# Patient Record
Sex: Female | Born: 1950
Health system: Southern US, Community
[De-identification: ages and names within clinical notes are randomized; demographics above are authoritative.]

## PROBLEM LIST (undated history)

## (undated) DIAGNOSIS — M1990 Unspecified osteoarthritis, unspecified site: Secondary | ICD-10-CM

## (undated) DIAGNOSIS — F419 Anxiety disorder, unspecified: Secondary | ICD-10-CM

## (undated) DIAGNOSIS — I10 Essential (primary) hypertension: Secondary | ICD-10-CM

## (undated) HISTORY — PX: JOINT REPLACEMENT: SHX530

## (undated) HISTORY — PX: SHOULDER SURGERY: SHX246

## (undated) HISTORY — PX: BREAST SURGERY: SHX581

## (undated) HISTORY — PX: BREAST CYST ASPIRATION: SHX578

## (undated) HISTORY — PX: ANKLE SURGERY: SHX546

## (undated) HISTORY — PX: BREAST CYST EXCISION: SHX579

---

## 2015-07-17 ENCOUNTER — Encounter: Payer: Self-pay | Admitting: *Deleted

## 2015-07-20 ENCOUNTER — Encounter
Admission: RE | Disposition: A | Discharge status: Home Health | Payer: Self-pay | Source: Ambulatory Visit | Attending: Unknown Physician Specialty

## 2015-07-20 ENCOUNTER — Encounter: Payer: Self-pay | Admitting: *Deleted

## 2015-07-20 ENCOUNTER — Ambulatory Visit: Payer: Medicare Other | Admitting: Anesthesiology

## 2015-07-20 ENCOUNTER — Ambulatory Visit
Admission: RE | Admit: 2015-07-20 | Discharge: 2015-07-20 | Disposition: A | Discharge status: Home Health | Payer: Medicare Other | Source: Ambulatory Visit | Attending: Unknown Physician Specialty | Admitting: Unknown Physician Specialty

## 2015-07-20 DIAGNOSIS — F17200 Nicotine dependence, unspecified, uncomplicated: Secondary | ICD-10-CM | POA: Insufficient documentation

## 2015-07-20 DIAGNOSIS — Z79899 Other long term (current) drug therapy: Secondary | ICD-10-CM | POA: Insufficient documentation

## 2015-07-20 DIAGNOSIS — K640 First degree hemorrhoids: Secondary | ICD-10-CM | POA: Diagnosis not present

## 2015-07-20 DIAGNOSIS — Z1211 Encounter for screening for malignant neoplasm of colon: Secondary | ICD-10-CM | POA: Insufficient documentation

## 2015-07-20 DIAGNOSIS — I10 Essential (primary) hypertension: Secondary | ICD-10-CM | POA: Diagnosis not present

## 2015-07-20 DIAGNOSIS — K5730 Diverticulosis of large intestine without perforation or abscess without bleeding: Secondary | ICD-10-CM | POA: Diagnosis not present

## 2015-07-20 DIAGNOSIS — Z9660 Presence of unspecified orthopedic joint implant: Secondary | ICD-10-CM | POA: Insufficient documentation

## 2015-07-20 DIAGNOSIS — J449 Chronic obstructive pulmonary disease, unspecified: Secondary | ICD-10-CM | POA: Diagnosis not present

## 2015-07-20 DIAGNOSIS — D122 Benign neoplasm of ascending colon: Secondary | ICD-10-CM | POA: Insufficient documentation

## 2015-07-20 DIAGNOSIS — D123 Benign neoplasm of transverse colon: Secondary | ICD-10-CM | POA: Insufficient documentation

## 2015-07-20 DIAGNOSIS — D125 Benign neoplasm of sigmoid colon: Secondary | ICD-10-CM | POA: Diagnosis not present

## 2015-07-20 DIAGNOSIS — F419 Anxiety disorder, unspecified: Secondary | ICD-10-CM | POA: Diagnosis not present

## 2015-07-20 DIAGNOSIS — M1990 Unspecified osteoarthritis, unspecified site: Secondary | ICD-10-CM | POA: Diagnosis not present

## 2015-07-20 HISTORY — DX: Essential (primary) hypertension: I10

## 2015-07-20 HISTORY — PX: COLONOSCOPY WITH PROPOFOL: SHX5780

## 2015-07-20 HISTORY — DX: Unspecified osteoarthritis, unspecified site: M19.90

## 2015-07-20 HISTORY — DX: Anxiety disorder, unspecified: F41.9

## 2015-07-20 SURGERY — COLONOSCOPY WITH PROPOFOL
Anesthesia: General

## 2015-07-20 MED ORDER — LIDOCAINE HCL (CARDIAC) 20 MG/ML IV SOLN
INTRAVENOUS | Status: DC | PRN
  Administered 2015-07-20: 67 mg via INTRAVENOUS

## 2015-07-20 MED ORDER — EPHEDRINE SULFATE 50 MG/ML IJ SOLN
INTRAMUSCULAR | Status: DC | PRN
  Administered 2015-07-20: 5 mg via INTRAVENOUS

## 2015-07-20 MED ORDER — MIDAZOLAM HCL 5 MG/5ML IJ SOLN
INTRAMUSCULAR | Status: DC | PRN
  Administered 2015-07-20: 1 mg via INTRAVENOUS

## 2015-07-20 MED ORDER — SODIUM CHLORIDE 0.9 % IV SOLN: INTRAVENOUS | Status: DC

## 2015-07-20 MED ORDER — FENTANYL CITRATE (PF) 100 MCG/2ML IJ SOLN
INTRAMUSCULAR | Status: DC | PRN
  Administered 2015-07-20: 50 ug via INTRAVENOUS

## 2015-07-20 MED ORDER — GLYCOPYRROLATE 0.2 MG/ML IJ SOLN
INTRAMUSCULAR | Status: DC | PRN
  Administered 2015-07-20: 0.2 mg via INTRAVENOUS

## 2015-07-20 MED ORDER — SODIUM CHLORIDE 0.9 % IV SOLN
INTRAVENOUS | Status: DC
  Administered 2015-07-20: 1000 mL via INTRAVENOUS

## 2015-07-20 MED ORDER — PROPOFOL 500 MG/50ML IV EMUL
INTRAVENOUS | Status: DC | PRN
  Administered 2015-07-20: 130 ug/kg/min via INTRAVENOUS

## 2015-07-20 MED ORDER — PROPOFOL 10 MG/ML IV BOLUS
INTRAVENOUS | Status: DC | PRN
  Administered 2015-07-20: 25 mg via INTRAVENOUS

## 2015-07-20 NOTE — Anesthesia Preprocedure Evaluation (Signed)
Anesthesia Evaluation  Patient identified by MRN, date of birth, ID band Patient awake    Reviewed: Allergy & Precautions, NPO status , Patient's Chart, lab work & pertinent test results  Airway Mallampati: II       Dental  (+) Teeth Intact   Pulmonary COPD, Current Smoker,     + decreased breath sounds      Cardiovascular hypertension,  Rhythm:Regular Rate:Normal     Neuro/Psych    GI/Hepatic negative GI ROS, Neg liver ROS,   Endo/Other  negative endocrine ROS  Renal/GU negative Renal ROS     Musculoskeletal negative musculoskeletal ROS (+)   Abdominal Normal abdominal exam  (+)   Peds negative pediatric ROS (+)  Hematology negative hematology ROS (+)   Anesthesia Other Findings   Reproductive/Obstetrics                             Anesthesia Physical Anesthesia Plan  ASA: II  Anesthesia Plan: General   Post-op Pain Management:    Induction: Intravenous  Airway Management Planned: Nasal Cannula  Additional Equipment:   Intra-op Plan:   Post-operative Plan:   Informed Consent: I have reviewed the patients History and Physical, chart, labs and discussed the procedure including the risks, benefits and alternatives for the proposed anesthesia with the patient or authorized representative who has indicated his/her understanding and acceptance.     Plan Discussed with: CRNA  Anesthesia Plan Comments:         Anesthesia Quick Evaluation

## 2015-07-20 NOTE — Transfer of Care (Signed)
Immediate Anesthesia Transfer of Care Note  Patient: Colleen Schaefer  Procedure(s) Performed: Procedure(s): COLONOSCOPY WITH PROPOFOL (N/A)  Patient Location: PACU  Anesthesia Type:General  Level of Consciousness: sedated  Airway & Oxygen Therapy: Patient Spontanous Breathing and Patient connected to nasal cannula oxygen  Post-op Assessment: Report given to RN and Post -op Vital signs reviewed and stable  Post vital signs: Reviewed and stable  Last Vitals:  Filed Vitals:   07/20/15 1517  BP: 94/51  Pulse: 69  Temp: 36 C  Resp: 22    Complications: No apparent anesthesia complications

## 2015-07-20 NOTE — Op Note (Signed)
[facility] Gastroenterology Patient Name: Colleen Schaefer Procedure Date: 07/20/2015 2:11 PM MRN: [id number] Account #: [id number] Date of Birth: 19-Jun-1951 Admit Type: Outpatient Age: 64 Room: [facility] ENDO ROOM 1 Gender: Female Note Status: Finalized Procedure:         Colonoscopy Indications:       Screening for colorectal malignant neoplasm Providers:         Manya Silvas, MD Referring MD:      Reyes Ivan, MD (Referring MD) Medicines:         Propofol per Anesthesia Complications:     No immediate complications. Procedure:         Pre-Anesthesia Assessment:                    - After reviewing the risks and benefits, the patient was                     deemed in satisfactory condition to undergo the procedure.                    After obtaining informed consent, the colonoscope was                     passed under direct vision. Throughout the procedure, the                     patient's blood pressure, pulse, and oxygen saturations                     were monitored continuously. The Colonoscope was                     introduced through the anus and advanced to the the cecum,                     identified by appendiceal orifice and ileocecal valve. The                     colonoscopy was technically difficult and complex due to                     restricted mobility of the colon and a tortuous colon.                     Successful completion of the procedure was aided by                     applying abdominal pressure. The patient tolerated the                     procedure well. The quality of the bowel preparation was                     excellent. Findings:      The sigmoid was very difficult and progress was slow and careful      A medium polyp was found at the hepatic flexure. The polyp was sessile.       The polyp was removed with a piecemeal technique using a hot snare.       Resection and retrieval were complete. To prevent bleeding after the        polypectomy, two hemostatic clips were successfully placed. There was no       bleeding during, and at the end, of  the procedure.      Many sessile polyps were found in the sigmoid colon. The polyps were       small in size. These polyps were removed with a hot snare. Resection and       retrieval were complete. bottles 1, 5,6,7      small polyps in Ascending colon placed in jar 2,3      Multiple small-mouthed diverticula were found in the sigmoid colon and       in the descending colon.      Internal hemorrhoids were found during endoscopy. The hemorrhoids were       small and Grade I (internal hemorrhoids that do not prolapse). Impression:        - One medium polyp at the hepatic flexure. Resected and                     retrieved. Clips were placed.                    - Many small polyps in the sigmoid colon. Resected and                     retrieved.                    - Diverticulosis in the sigmoid colon and in the                     descending colon.                    - Internal hemorrhoids. Recommendation:    - Await pathology results. Manya Silvas, MD 07/20/2015 3:15:44 PM This report has been signed electronically. Number of Addenda: 0 Note Initiated On: 07/20/2015 2:11 PM Total Procedure Duration: 0 hours 54 minutes 4 seconds       [facility]

## 2015-07-20 NOTE — H&P (Signed)
   Primary Care Physician:  Lynnell Jude, MD Primary Gastroenterologist:  Dr. Vira Agar  Pre-Procedure History & Physical: HPI:  Colleen Schaefer is a 64 y.o. female is here for an colonoscopy.   Past Medical History  Diagnosis Date  . Anxiety   . Arthritis   . Hypertension     Past Surgical History  Procedure Laterality Date  . Ankle surgery    . Breast surgery      Cyst  . Shoulder surgery    . Joint replacement      Prior to Admission medications   Medication Sig Start Date End Date Taking? Authorizing Provider  escitalopram (LEXAPRO) 10 MG tablet Take 10 mg by mouth daily.   Yes Historical Provider, MD  estradiol (ESTRACE) 0.1 MG/GM vaginal cream Place 2 Applicatorfuls vaginally daily.   Yes Historical Provider, MD  meloxicam (MOBIC) 7.5 MG tablet Take 7.5 mg by mouth daily.   Yes Historical Provider, MD    Allergies as of 06/16/2015  . (Not on File)    History reviewed. No pertinent family history.  Social History   Social History  . Marital Status: Married    Spouse Name: N/A  . Number of Children: N/A  . Years of Education: N/A   Occupational History  . Not on file.   Social History Main Topics  . Smoking status: Current Every Day Smoker -- 1.00 packs/day for 40 years  . Smokeless tobacco: Never Used  . Alcohol Use: Yes  . Drug Use: No  . Sexual Activity: Not on file   Other Topics Concern  . Not on file   Social History Narrative    Review of Systems: See HPI, otherwise negative ROS  Physical Exam: BP 122/69 mmHg  Pulse 78  Temp(Src) 98.5 F (36.9 C) (Oral)  Resp 20  Ht 5\' 2"  (1.575 m)  Wt 58.968 kg (130 lb)  BMI 23.77 kg/m2  SpO2 100% General:   Alert,  pleasant and cooperative in NAD Head:  Normocephalic and atraumatic. Neck:  Supple; no masses or thyromegaly. Lungs:  Clear throughout to auscultation.    Heart:  Regular rate and rhythm. Abdomen:  Soft, nontender and nondistended. Normal bowel sounds, without guarding, and without  rebound.   Neurologic:  Alert and  oriented x4;  grossly normal neurologically.  Impression/Plan: Colleen Schaefer is here for an colonoscopy to be performed for screening  Risks, benefits, limitations, and alternatives regarding  colonoscopy have been reviewed with the patient.  Questions have been answered.  All parties agreeable.   Gaylyn Cheers, MD  07/20/2015, 2:07 PM

## 2015-07-21 NOTE — Anesthesia Postprocedure Evaluation (Signed)
  Anesthesia Post-op Note  Patient: Colleen Schaefer  Procedure(s) Performed: Procedure(s): COLONOSCOPY WITH PROPOFOL (N/A)  Anesthesia type:General  Patient location: PACU  Post pain: Pain level controlled  Post assessment: Post-op Vital signs reviewed, Patient's Cardiovascular Status Stable, Respiratory Function Stable, Patent Airway and No signs of Nausea or vomiting  Post vital signs: Reviewed and stable  Last Vitals:  Filed Vitals:   07/20/15 1545  BP: 116/60  Pulse: 70  Temp:   Resp: 16    Level of consciousness: awake, alert  and patient cooperative  Complications: No apparent anesthesia complications

## 2015-07-22 ENCOUNTER — Encounter: Payer: Self-pay | Admitting: Unknown Physician Specialty

## 2015-07-22 LAB — SURGICAL PATHOLOGY

## 2015-07-24 NOTE — Addendum Note (Signed)
Addendum  created 07/24/15 1618 by Iver Nestle, MD   Modules edited: Anesthesia Attestations

## 2015-08-17 ENCOUNTER — Encounter: Payer: Self-pay | Admitting: Emergency Medicine

## 2015-08-17 ENCOUNTER — Inpatient Hospital Stay
Admission: EM | Admit: 2015-08-17 | Discharge: 2015-08-20 | DRG: 552 | Disposition: A | Discharge status: Skilled Nursing Facility | Payer: Medicare Other | Attending: Internal Medicine | Admitting: Internal Medicine

## 2015-08-17 ENCOUNTER — Emergency Department: Payer: Medicare Other

## 2015-08-17 DIAGNOSIS — Z79899 Other long term (current) drug therapy: Secondary | ICD-10-CM

## 2015-08-17 DIAGNOSIS — Y929 Unspecified place or not applicable: Secondary | ICD-10-CM

## 2015-08-17 DIAGNOSIS — I959 Hypotension, unspecified: Secondary | ICD-10-CM | POA: Diagnosis present

## 2015-08-17 DIAGNOSIS — S3210XA Unspecified fracture of sacrum, initial encounter for closed fracture: Secondary | ICD-10-CM | POA: Diagnosis present

## 2015-08-17 DIAGNOSIS — I10 Essential (primary) hypertension: Secondary | ICD-10-CM | POA: Diagnosis present

## 2015-08-17 DIAGNOSIS — B9620 Unspecified Escherichia coli [E. coli] as the cause of diseases classified elsewhere: Secondary | ICD-10-CM | POA: Diagnosis present

## 2015-08-17 DIAGNOSIS — Z9889 Other specified postprocedural states: Secondary | ICD-10-CM | POA: Diagnosis not present

## 2015-08-17 DIAGNOSIS — F329 Major depressive disorder, single episode, unspecified: Secondary | ICD-10-CM | POA: Diagnosis present

## 2015-08-17 DIAGNOSIS — Z9660 Presence of unspecified orthopedic joint implant: Secondary | ICD-10-CM | POA: Diagnosis present

## 2015-08-17 DIAGNOSIS — Z809 Family history of malignant neoplasm, unspecified: Secondary | ICD-10-CM

## 2015-08-17 DIAGNOSIS — Z8249 Family history of ischemic heart disease and other diseases of the circulatory system: Secondary | ICD-10-CM

## 2015-08-17 DIAGNOSIS — S329XXA Fracture of unspecified parts of lumbosacral spine and pelvis, initial encounter for closed fracture: Secondary | ICD-10-CM

## 2015-08-17 DIAGNOSIS — M1990 Unspecified osteoarthritis, unspecified site: Secondary | ICD-10-CM | POA: Diagnosis present

## 2015-08-17 DIAGNOSIS — F419 Anxiety disorder, unspecified: Secondary | ICD-10-CM | POA: Diagnosis present

## 2015-08-17 DIAGNOSIS — R300 Dysuria: Secondary | ICD-10-CM | POA: Diagnosis present

## 2015-08-17 DIAGNOSIS — Z716 Tobacco abuse counseling: Secondary | ICD-10-CM

## 2015-08-17 DIAGNOSIS — S0990XA Unspecified injury of head, initial encounter: Secondary | ICD-10-CM | POA: Diagnosis present

## 2015-08-17 DIAGNOSIS — W1839XA Other fall on same level, initial encounter: Secondary | ICD-10-CM | POA: Diagnosis present

## 2015-08-17 DIAGNOSIS — S32591A Other specified fracture of right pubis, initial encounter for closed fracture: Secondary | ICD-10-CM | POA: Diagnosis present

## 2015-08-17 DIAGNOSIS — F17210 Nicotine dependence, cigarettes, uncomplicated: Secondary | ICD-10-CM | POA: Diagnosis present

## 2015-08-17 DIAGNOSIS — Y93K1 Activity, walking an animal: Secondary | ICD-10-CM | POA: Diagnosis not present

## 2015-08-17 DIAGNOSIS — Z885 Allergy status to narcotic agent status: Secondary | ICD-10-CM | POA: Diagnosis not present

## 2015-08-17 DIAGNOSIS — N390 Urinary tract infection, site not specified: Secondary | ICD-10-CM | POA: Diagnosis present

## 2015-08-17 LAB — URINALYSIS COMPLETE WITH MICROSCOPIC (ARMC ONLY)
Bilirubin Urine: NEGATIVE
Glucose, UA: NEGATIVE mg/dL
Ketones, ur: NEGATIVE mg/dL
Nitrite: POSITIVE — AB
PH: 5 (ref 5.0–8.0)
PROTEIN: NEGATIVE mg/dL
SQUAMOUS EPITHELIAL / LPF: NONE SEEN
Specific Gravity, Urine: 1.018 (ref 1.005–1.030)

## 2015-08-17 LAB — CBC WITH DIFFERENTIAL/PLATELET
Basophils Absolute: 0.1 10*3/uL (ref 0–0.1)
Basophils Relative: 1 %
EOS ABS: 0.2 10*3/uL (ref 0–0.7)
Eosinophils Relative: 1 %
HCT: 37.3 % (ref 35.0–47.0)
HEMOGLOBIN: 12.2 g/dL (ref 12.0–16.0)
LYMPHS ABS: 1.6 10*3/uL (ref 1.0–3.6)
LYMPHS PCT: 11 %
MCH: 29.3 pg (ref 26.0–34.0)
MCHC: 32.8 g/dL (ref 32.0–36.0)
MCV: 89.5 fL (ref 80.0–100.0)
MONOS PCT: 5 %
Monocytes Absolute: 0.7 10*3/uL (ref 0.2–0.9)
NEUTROS PCT: 82 %
Neutro Abs: 12.1 10*3/uL — ABNORMAL HIGH (ref 1.4–6.5)
Platelets: 252 10*3/uL (ref 150–440)
RBC: 4.16 MIL/uL (ref 3.80–5.20)
RDW: 13.1 % (ref 11.5–14.5)
WBC: 14.6 10*3/uL — ABNORMAL HIGH (ref 3.6–11.0)

## 2015-08-17 LAB — COMPREHENSIVE METABOLIC PANEL
ALT: 16 U/L (ref 14–54)
AST: 21 U/L (ref 15–41)
Albumin: 3.7 g/dL (ref 3.5–5.0)
Alkaline Phosphatase: 83 U/L (ref 38–126)
Anion gap: 4 — ABNORMAL LOW (ref 5–15)
BUN: 24 mg/dL — AB (ref 6–20)
CHLORIDE: 107 mmol/L (ref 101–111)
CO2: 29 mmol/L (ref 22–32)
CREATININE: 0.9 mg/dL (ref 0.44–1.00)
Calcium: 8.8 mg/dL — ABNORMAL LOW (ref 8.9–10.3)
GFR calc non Af Amer: >60 mL/min (ref >60)
Glucose, Bld: 88 mg/dL (ref 65–99)
Potassium: 3.9 mmol/L (ref 3.5–5.1)
SODIUM: 140 mmol/L (ref 135–145)
Total Bilirubin: 0.3 mg/dL (ref 0.3–1.2)
Total Protein: 7.2 g/dL (ref 6.5–8.1)

## 2015-08-17 MED ORDER — HYDROMORPHONE HCL 1 MG/ML IJ SOLN
0.5000 mg | Freq: Once | INTRAMUSCULAR | Status: AC
Start: 2015-08-17 — End: 2015-08-17
  Administered 2015-08-17: 0.5 mg via INTRAVENOUS
  Filled 2015-08-17: qty 1

## 2015-08-17 MED ORDER — ACETAMINOPHEN 325 MG PO TABS: 650.0000 mg | ORAL_TABLET | Freq: Four times a day (QID) | ORAL | Status: DC | PRN

## 2015-08-17 MED ORDER — HYDROMORPHONE HCL 1 MG/ML IJ SOLN
0.5000 mg | Freq: Once | INTRAMUSCULAR | Status: AC
  Administered 2015-08-17: 0.5 mg via INTRAVENOUS

## 2015-08-17 MED ORDER — ENOXAPARIN SODIUM 40 MG/0.4ML ~~LOC~~ SOLN
40.0000 mg | SUBCUTANEOUS | Status: DC
  Administered 2015-08-17 – 2015-08-19 (×3): 40 mg via SUBCUTANEOUS
  Filled 2015-08-17 (×3): qty 0.4

## 2015-08-17 MED ORDER — ONDANSETRON HCL 4 MG/2ML IJ SOLN: 4.0000 mg | Freq: Four times a day (QID) | INTRAMUSCULAR | Status: DC | PRN

## 2015-08-17 MED ORDER — MELOXICAM 7.5 MG PO TABS
7.5000 mg | ORAL_TABLET | Freq: Every day | ORAL | Status: DC
  Administered 2015-08-18 – 2015-08-20 (×2): 7.5 mg via ORAL
  Filled 2015-08-17 (×3): qty 1

## 2015-08-17 MED ORDER — ONDANSETRON HCL 4 MG/2ML IJ SOLN
4.0000 mg | Freq: Once | INTRAMUSCULAR | Status: AC
  Administered 2015-08-17: 4 mg via INTRAVENOUS
  Filled 2015-08-17: qty 2

## 2015-08-17 MED ORDER — ACETAMINOPHEN 650 MG RE SUPP: 650.0000 mg | Freq: Four times a day (QID) | RECTAL | Status: DC | PRN

## 2015-08-17 MED ORDER — SODIUM CHLORIDE 0.9 % IV SOLN
INTRAVENOUS | Status: DC
  Administered 2015-08-17: 15:00:00 via INTRAVENOUS

## 2015-08-17 MED ORDER — ONDANSETRON HCL 4 MG PO TABS
4.0000 mg | ORAL_TABLET | Freq: Four times a day (QID) | ORAL | Status: DC | PRN
  Administered 2015-08-18: 4 mg via ORAL
  Filled 2015-08-17: qty 1

## 2015-08-17 MED ORDER — NICOTINE 7 MG/24HR TD PT24
7.0000 mg | MEDICATED_PATCH | Freq: Every day | TRANSDERMAL | Status: DC
  Administered 2015-08-17 – 2015-08-20 (×3): 7 mg via TRANSDERMAL
  Filled 2015-08-17 (×4): qty 1

## 2015-08-17 MED ORDER — ESCITALOPRAM OXALATE 10 MG PO TABS
10.0000 mg | ORAL_TABLET | Freq: Every day | ORAL | Status: DC
  Administered 2015-08-17 – 2015-08-20 (×3): 10 mg via ORAL
  Filled 2015-08-17 (×4): qty 1

## 2015-08-17 MED ORDER — OXYCODONE-ACETAMINOPHEN 5-325 MG PO TABS
1.0000 | ORAL_TABLET | ORAL | Status: DC | PRN
  Administered 2015-08-17 – 2015-08-20 (×10): 1 via ORAL
  Filled 2015-08-17 (×10): qty 1

## 2015-08-17 MED ORDER — ENOXAPARIN SODIUM 40 MG/0.4ML ~~LOC~~ SOLN
40.0000 mg | SUBCUTANEOUS | Status: DC
  Filled 2015-08-17: qty 0.4

## 2015-08-17 MED ORDER — HYDROMORPHONE HCL 1 MG/ML IJ SOLN
INTRAMUSCULAR | Status: AC
  Administered 2015-08-17: 0.5 mg via INTRAVENOUS
  Filled 2015-08-17: qty 1

## 2015-08-17 NOTE — H&P (Signed)
[place] [facility] [place] NAME: Colleen Schaefer    MR#:  [id number]  DATE OF BIRTH:  July 20, 1951  DATE OF ADMISSION:  08/17/2015  PRIMARY CARE PHYSICIAN: BLISS, Lynnell Jude, MD   REQUESTING/REFERRING PHYSICIAN: Conni Slipper  CHIEF COMPLAINT:   Chief Complaint  Patient presents with  . Fall  . Hip Pain  . Groin Pain  . Head Injury    HISTORY OF PRESENT ILLNESS:  Colleen Schaefer  is a 64 y.o. female presented with a fall. She was walking a Retail banker and the dog started running. The patient still held onto the leash and did not let go. She fell to the ground and had pain in the right back and pelvis area. Initially the pain was 10 out of 10 intensity. Now down to 5 out of 10 intensity with pain medications. She was given some IV dye loaded in the emergency room and blood pressure and pulse ox dropped down. Hospitalist services were contacted for further evaluation.  PAST MEDICAL HISTORY:   Past Medical History  Diagnosis Date  . Anxiety   . Arthritis   . Hypertension     PAST SURGICAL HISTORY:   Past Surgical History  Procedure Laterality Date  . Ankle surgery    . Breast surgery      Cyst  . Shoulder surgery    . Joint replacement    . Colonoscopy with propofol N/A 07/20/2015    Procedure: COLONOSCOPY WITH PROPOFOL;  Surgeon: Manya Silvas, MD;  Location: [facility] ENDOSCOPY;  Service: Endoscopy;  Laterality: N/A;    SOCIAL HISTORY:   Social History  Substance Use Topics  . Smoking status: Current Every Day Smoker -- 0.50 packs/day for 40 years  . Smokeless tobacco: Never Used  . Alcohol Use: Yes    FAMILY HISTORY:   Family History  Problem Relation Age of Onset  . Cancer Mother   . CAD Father     DRUG ALLERGIES:   Allergies  Allergen Reactions  . Morphine And Related Nausea And Vomiting    REVIEW OF SYSTEMS:  CONSTITUTIONAL: No fever, fatigue or weakness.  EYES: No blurred or double vision.  Wears glasses EARS, NOSE, AND THROAT: No tinnitus or ear pain. No sore throat. Current dysphagia, she stirs T. RESPIRATORY: Some cough with clear phlegm, no shortness of breath, wheezing or hemoptysis.  CARDIOVASCULAR: No chest pain, orthopnea, edema.  GASTROINTESTINAL: No nausea, vomiting, diarrhea or abdominal pain. No blood in bowel movements GENITOURINARY: No dysuria, hematuria.  ENDOCRINE: No polyuria, nocturia,  HEMATOLOGY: No anemia, easy bruising or bleeding SKIN: No rash or lesion. MUSCULOSKELETAL: Pain in back and right pelvis area NEUROLOGIC: No tingling, numbness, weakness.  PSYCHIATRY: Positive for anxiety or depression. No suicidal or homicidal ideation.  MEDICATIONS AT HOME:   Prior to Admission medications   Medication Sig Start Date End Date Taking? Authorizing Provider  escitalopram (LEXAPRO) 10 MG tablet Take 10 mg by mouth daily.   Yes Historical Provider, MD  meloxicam (MOBIC) 7.5 MG tablet Take 7.5 mg by mouth daily.    Historical Provider, MD      VITAL SIGNS:  Blood pressure 118/59, pulse 64, temperature 97.5 F (36.4 C), resp. rate 16, height 5\' 3"  (1.6 m), weight 58.968 kg (130 lb), SpO2 99 %.  PHYSICAL EXAMINATION:  GENERAL:  64 y.o.-year-old patient lying in the bed with no acute distress.  EYES: Pupils equal, round, reactive to light and accommodation. No scleral icterus. Extraocular muscles  intact.  HEENT: Head atraumatic, normocephalic. Oropharynx and nasopharynx clear.  NECK:  Supple, no jugular venous distention. No thyroid enlargement, no tenderness.  LUNGS: Normal breath sounds bilaterally, no wheezing, rales,rhonchi or crepitation. No use of accessory muscles of respiration.  CARDIOVASCULAR: S1, S2 normal. No murmurs, rubs, or gallops.  ABDOMEN: Soft, nontender, nondistended. Bowel sounds present. No organomegaly or mass.  EXTREMITIES: No pedal edema, cyanosis, or clubbing.  NEUROLOGIC: Cranial nerves II through XII are intact. Patient unable to  straight leg raise with her right leg. Pain with flexion and extension at the ankle also. Sensation intact. Gait not checked.  PSYCHIATRIC: The patient is alert and oriented x 3.  SKIN: No rash, lesion, or ulcer.   LABORATORY PANEL:   CBC  Recent Labs Lab 08/17/15 0859  WBC 14.6*  HGB 12.2  HCT 37.3  PLT 252   ------------------------------------------------------------------------------------------------------------------  Chemistries   Recent Labs Lab 08/17/15 0859  NA 140  K 3.9  CL 107  CO2 29  GLUCOSE 88  BUN 24*  CREATININE 0.90  CALCIUM 8.8*  AST 21  ALT 16  ALKPHOS 83  BILITOT 0.3   ------------------------------------------------------------------------------------------------------------------   RADIOLOGY:  Ct Pelvis Wo Contrast  08/17/2015  CLINICAL DATA:  Fall while walking dog. Pain on right hip and groin region. EXAM: CT PELVIS WITHOUT CONTRAST TECHNIQUE: Multidetector CT imaging of the pelvis was performed following the standard protocol without intravenous contrast. COMPARISON:  Plain films earlier today FINDINGS: There are fractures through the right sacral ala. No definite left sacral fracture. Fractures are noted through the right superior and inferior pubic rami. No femoral neck fracture. There is sigmoid diverticulosis. No active diverticulitis. Small amount of stranding is noted along the anterior bladder surface adjacent to the right pubic bone fractures compatible with small extraperitoneal hematoma. IMPRESSION: Fracture through the right sacrum, right superior and inferior pubic rami. Small right extraperitoneal hematoma adjacent to the right anterior bladder wall. Electronically Signed   By: Rolm Baptise M.D.   On: 08/17/2015 10:21   Dg Hip Unilat With Pelvis 2-3 Views Right  08/17/2015  CLINICAL DATA:  Right hip pain after falling while walking dog. Initial encounter. EXAM: DG HIP (WITH OR WITHOUT PELVIS) 2-3V RIGHT COMPARISON:  None. FINDINGS:  The bones are demineralized. There are comminuted and mildly displaced fractures of the right superior and inferior pubic rami. No acetabular extension identified. There is no evidence of proximal femur fracture or dislocation. The sacroiliac joints and symphysis pubis appear intact. However, there is subtle irregularity of the sacral foramina on the right and vertical lucency within the sacrum on the left, suspicious for nondisplaced bilateral sacral fractures. IMPRESSION: 1. Comminuted mildly displaced fractures of the right superior and inferior pubic rami. 2. Possible nondisplaced sacral fractures bilaterally. 3. No evidence of proximal femur fracture or dislocation. Electronically Signed   By: Richardean Sale M.D.   On: 08/17/2015 08:46    EKG:   Sinus rhythm 66 bpm. Interventricular conduction delay  IMPRESSION AND PLAN:   1. Right sacral fracture, right pubic rami fracture superior and inferior. Will admit to the hospital. Get orthopedic and physical therapy consultations. Patient may end up needing rehabilitation depending on how she does with physical therapy. Pain control with oral Percocet. Try to avoid IV dilaudid since her blood pressure and pulse ox dropped down with this medication. 2. Relative hypotension- I will give IV fluid hydration. Likely secondary to IV pain medications 3. Drop in pulse ox after pain medication- check pulse ox  on room air 4. Anxiety and depression- continue Lexapro 5. Tobacco abuse- smoking cessation counseling done 3 minutes by me and nicotine patch applied.  All the records are reviewed and case discussed with ED provider. Management plans discussed with the patient, family and they are in agreement.  CODE STATUS: Full code  TOTAL TIME TAKING CARE OF THIS PATIENT: 50 minutes.    Loletha Grayer M.D on 08/17/2015 at 12:42 PM  Between 7am to 6pm - Pager - [phone]  After 6pm call admission pager [facility]  Office   [phone]  CC: Primary care physician; Lynnell Jude, MD

## 2015-08-17 NOTE — Care Management Note (Signed)
Case Management Note  Patient Details  Name: Jalyssa Mamone MRN: [id number] Date of Birth: Oct 06, 1950  Subjective/Objective:   64yo Mrs Donishia Shamp was admitted 08/17/15 following a fall at home while walking her dog. Dx: pubic ramus fx. No surgical intervention planned. PCP=Laura Bliss. Pharmacy=[facility] in [place]. Resides with her husband. No home assistive equipment at home. No home oxygen or home health services. Gave list of home health providers to husband from which to choose in the event that Mrs Mosbey is discharged home with home health. Anticipate need for a rolling walker at home. Case management will continue to follow for discharge planning.                   Action/Plan:   Expected Discharge Date:                  Expected Discharge Plan:     In-House Referral:     Discharge planning Services     Post Acute Care Choice:    Choice offered to:     DME Arranged:    DME Agency:     HH Arranged:    [place] Agency:     Status of Service:     Medicare Important Message Given:    Date Medicare IM Given:    Medicare IM give by:    Date Additional Medicare IM Given:    Additional Medicare Important Message give by:     If discussed at [facility] of Stay Meetings, dates discussed:    Additional Comments:  Tremaine Fuhriman A, RN 08/17/2015, 2:55 PM

## 2015-08-17 NOTE — Consult Note (Signed)
Patient is seen after suffering a fall earlier today. She was walking her dog and was pulled down. She was unable to bear weight following this. She was brought to the emergency room she is found to have a right pubic rami fracture and left sacral fracture. She is having a great deal pain and is admitted for the treatment of his condition. 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  On exam she has palpable pulses in both lower extremities, able flex extend the toes, both feet. Sensation intact to both feet. She has no pain with logrolling of the left leg some pain in the groin with logrolling on the right. She's tender palpation over the right pubis and the very tender over the left side of the sacrum.  Radiographs and CT reviewed showing minimally displaced pubic ramus fractures and sacral fracture minimally displaced. Impression pelvic fracture severe pain and inability to ambulate.  Recommendation is physical therapy to try to get her ambulatory and pain control

## 2015-08-17 NOTE — ED Provider Notes (Signed)
[facility] Emergency Department Provider Note  ____________________________________________  Time seen: Approximately 11:25 AM  I have reviewed the triage vital signs and the nursing notes.   HISTORY  Chief Complaint Fall; Hip Pain; Groin Pain; and Head Injury    HPI Colleen Schaefer is a 64 y.o. female patient is walking her dog and pulled her and she fell. She complains of left hip pain and pain in the groin on the right side. Patient reports she was able to walk briefly but now cannot stand the pain anymore can hardly move because the pain. Patient denies any other medical problems except for what is listed below. The pain is now severe sharp in nature patient reports when she moves she feels something moving in her pelvis.  Past Medical History  Diagnosis Date  . Anxiety   . Arthritis   . Hypertension     Patient Active Problem List   Diagnosis Date Noted  . Fracture of sacrum ([place]) 08/17/2015    Past Surgical History  Procedure Laterality Date  . Ankle surgery    . Breast surgery      Cyst  . Shoulder surgery    . Joint replacement    . Colonoscopy with propofol N/A 07/20/2015    Procedure: COLONOSCOPY WITH PROPOFOL;  Surgeon: Manya Silvas, MD;  Location: [facility] ENDOSCOPY;  Service: Endoscopy;  Laterality: N/A;    No current outpatient prescriptions on file.  Allergies Morphine and related  Family History  Problem Relation Age of Onset  . Cancer Mother   . CAD Father     Social History Social History  Substance Use Topics  . Smoking status: Current Every Day Smoker -- 0.50 packs/day for 40 years  . Smokeless tobacco: Never Used  . Alcohol Use: Yes    Review of Systems Constitutional: No fever/chills Eyes: No visual changes. ENT: No sore throat. Cardiovascular: Denies chest pain. Respiratory: Denies shortness of breath. Gastrointestinal: No abdominal pain.  No nausea, no vomiting.  No diarrhea.  No  constipation. Genitourinary: Negative for dysuria. Musculoskeletal: Negative for back pain. Skin: Negative for rash. Neurological: Negative for headaches, focal weakness or numbness.  10-point ROS otherwise negative.  ____________________________________________   PHYSICAL EXAM:  VITAL SIGNS: ED Triage Vitals  Enc Vitals Group     BP 08/17/15 0800 100/58 mmHg     Pulse Rate 08/17/15 0800 68     Resp 08/17/15 0800 16     Temp 08/17/15 0800 97.5 F (36.4 C)     Temp src --      SpO2 08/17/15 0800 99 %     Weight 08/17/15 0800 130 lb (58.968 kg)     Height 08/17/15 0800 5\' 3"  (1.6 m)     Head Cir --      Peak Flow --      Pain Score 08/17/15 0801 4     Pain Loc --      Pain Edu? --      Excl. in [place]? --     Constitutional: Alert and oriented. Planning on of pain in her hip and pelvis. Eyes: Conjunctivae are normal. PERRL. EOMI. Head: Atraumatic. Nose: No congestion/rhinnorhea. Mouth/Throat: Mucous membranes are moist.  Oropharynx non-erythematous. Neck: No stridor.Cardiovascular: Normal rate, regular rhythm. Grossly normal heart sounds.  Good peripheral circulation. Respiratory: Normal respiratory effort.  No retractions. Lungs CTAB. Gastrointestinal: Soft and nontender. No distention. No abdominal bruits. No CVA tenderness. Musculoskeletal: No lower extremity tenderness nor edema.  No joint effusions. Neurologic:  Normal speech and language. No gross focal neurologic deficits are appreciated. No gait instability. Skin:  Skin is warm, dry and intact. No rash noted. Psychiatric: Mood and affect are normal. Speech and behavior are normal.  ____________________________________________   LABS (all labs ordered are listed, but only abnormal results are displayed)  Labs Reviewed  COMPREHENSIVE METABOLIC PANEL - Abnormal; Notable for the following:    BUN 24 (*)    Calcium 8.8 (*)    Anion gap 4 (*)    All other components within normal limits  CBC WITH  DIFFERENTIAL/PLATELET - Abnormal; Notable for the following:    WBC 14.6 (*)    Neutro Abs 12.1 (*)    All other components within normal limits  URINALYSIS COMPLETEWITH MICROSCOPIC ([facility] ONLY)   ____________________________________________  EKG   ____________________________________________  RADIOLOGY  Plan fill should plain films show a right sided superior and inferior pubic ramus fracture and the possibility of a sacral fracture. CT shows the pubic rate is fractures and a sacral fracture. Nondisplaced sacral fracture. Some extraperitoneal hematoma around the pubic ramus fractures. Films reviewed with Dr. Youlanda Mighty who feels there is no surgical treatment at present. ____________________________________________   PROCEDURES    ____________________________________________   INITIAL IMPRESSION / ASSESSMENT AND PLAN / ED COURSE  Pertinent labs & imaging results that were available during my care of the patient were reviewed by me and considered in my medical decision making (see chart for details).  ____________________________________________   FINAL CLINICAL IMPRESSION(S) / ED DIAGNOSES  Final diagnoses:  Pelvic fracture, closed, initial encounter      Nena Polio, MD 08/17/15 1535

## 2015-08-17 NOTE — Progress Notes (Signed)
PT Cancellation Note  Patient Details Name: Colleen Schaefer MRN: [id number] DOB: [birth year]   Cancelled Treatment:    Reason Eval/Treat Not Completed: Medical issues which prohibited therapy (Consult received and chart reviewed.  Patient newly admitted; pending ortho consult due to acute sacral, pubic rami fractures.  Will hold until consult complete and patient cleared for mobility/WBing.)  [place] Riesen H. Owens Shark, PT, DPT, NCS 08/17/2015, 3:14 PM [phone]

## 2015-08-17 NOTE — ED Notes (Signed)
Pt resting, pain med given for right groin pain, husband at bedside.

## 2015-08-17 NOTE — ED Notes (Signed)
Was walking her dog and dog pulled her and she fell to right side.  Pain in hip rad to groin and hit head.  No loc.  Pedal pulse palpable right side.

## 2015-08-17 NOTE — ED Notes (Signed)
2L  applied for sats dropping into mid 80's while pt sleeps, due to dilaudid administration. Pt resting, tolerating well.

## 2015-08-18 LAB — BASIC METABOLIC PANEL
ANION GAP: 4 — AB (ref 5–15)
BUN: 19 mg/dL (ref 6–20)
CALCIUM: 8 mg/dL — AB (ref 8.9–10.3)
CHLORIDE: 108 mmol/L (ref 101–111)
CO2: 26 mmol/L (ref 22–32)
CREATININE: 0.7 mg/dL (ref 0.44–1.00)
Glucose, Bld: 102 mg/dL — ABNORMAL HIGH (ref 65–99)
POTASSIUM: 3.8 mmol/L (ref 3.5–5.1)
SODIUM: 138 mmol/L (ref 135–145)

## 2015-08-18 LAB — CBC
HEMATOCRIT: 33 % — AB (ref 35.0–47.0)
Hemoglobin: 11 g/dL — ABNORMAL LOW (ref 12.0–16.0)
MCH: 29.9 pg (ref 26.0–34.0)
MCHC: 33.3 g/dL (ref 32.0–36.0)
MCV: 89.7 fL (ref 80.0–100.0)
PLATELETS: 202 10*3/uL (ref 150–440)
RBC: 3.68 MIL/uL — ABNORMAL LOW (ref 3.80–5.20)
RDW: 13 % (ref 11.5–14.5)
WBC: 9.1 10*3/uL (ref 3.6–11.0)

## 2015-08-18 MED ORDER — CEPHALEXIN 500 MG PO CAPS
500.0000 mg | ORAL_CAPSULE | Freq: Two times a day (BID) | ORAL | Status: DC
  Administered 2015-08-18 – 2015-08-20 (×4): 500 mg via ORAL
  Filled 2015-08-18 (×5): qty 1

## 2015-08-18 NOTE — Clinical Social Work Placement (Signed)
   CLINICAL SOCIAL WORK PLACEMENT  NOTE  Date:  08/18/2015  Patient Details  Name: Colleen Schaefer MRN: [id number] Date of Birth: 10/05/1950  Clinical Social Work is seeking post-discharge placement for this patient at the [place] level of care (*CSW will initial, date and re-position this form in  chart as items are completed):  Yes   Patient/family provided with [place] Work Department's list of facilities offering this level of care within the geographic area requested by the patient (or if unable, by the patient's family).  Yes   Patient/family informed of their freedom to choose among providers that offer the needed level of care, that participate in Medicare, Medicaid or managed care program needed by the patient, have an available bed and are willing to accept the patient.  Yes   Patient/family informed of [facility]'s ownership interest in [facility] and [facility], as well as of the fact that they are under no obligation to receive care at these facilities.  PASRR submitted to EDS on 08/18/15     PASRR number received on 08/18/15     Existing PASRR number confirmed on       FL2 transmitted to all facilities in geographic area requested by pt/family on 08/18/15     FL2 transmitted to all facilities within larger geographic area on       Patient informed that his/her managed care company has contracts with or will negotiate with certain facilities, including the following:            Patient/family informed of bed offers received.  Patient chooses bed at       Physician recommends and patient chooses bed at      Patient to be transferred to   on  .  Patient to be transferred to facility by       Patient family notified on   of transfer.  Name of family member notified:        PHYSICIAN       Additional Comment:    _______________________________________________ Loralyn Freshwater, LCSW 08/18/2015, 11:32 AM

## 2015-08-18 NOTE — Clinical Social Work Note (Signed)
Clinical Social Work Assessment  Patient Details  Name: Colleen Schaefer MRN: [id number] Date of Birth: [birth year]  Date of referral:  08/18/15               Reason for consult:  Facility Placement                Permission sought to share information with:  Chartered certified accountant granted to share information::  Yes, Verbal Permission Granted  Name::      Colleen::   Schaefer   Relationship::     Contact Information:     Housing/Transportation Living arrangements for the past 2 months:  [place] of Information:  Patient Patient Interpreter Needed:  None Criminal Activity/Legal Involvement Pertinent to Current Situation/Hospitalization:  No - Comment as needed Significant Relationships:  Spouse Lives with:  Spouse Do you feel safe going back to the place where you live?  Yes Need for family participation in patient care:  Yes (Comment)  Care giving concerns: Patient lives with her husband Colleen Schaefer in [place].    Social Worker assessment / plan: [place] representative (CSW) received SNF consult. PT is recommending SNF. CSW met with patient to discuss D/C plan. Patient was alert and oriented and sitting up in the chair. CSW introduced self and explained role of CSW department. Patient reported that she lives with her husband Colleen Schaefer in [place] and they just moved to [facility] in March 2016 from [place]. Per patient they have no children and Colleen Schaefer works during the day. CSW explained that PT is recommending short term rehab. CSW also explained that patient will need a 3 night qualifying inpatient stay in order for Medicare to pay for rehab at SNF. CSW explained that most pelvic fractures are observation but patient is currently inpatient as of 08/17/15. CSW explained that if patient changes to observation status then Medicare will not pay for rehab. Patient verbalized her understanding. Patient reported that she cannot pay privately for rehab.  Patient requested a facility close to [facility]. SNF list was provided.   FL2 complete and faxed out.    Employment status:  Retired Forensic scientist:  Medicare PT Recommendations:  [place] / Referral to community resources:  [place]  Patient/Family's Response to care: Patient is agreeable to [employer] and prefers [employer].   Patient/Family's Understanding of and Emotional Response to Diagnosis, Current Treatment, and Prognosis: Patient was pleasant throughout assessment.   Emotional Assessment Appearance:  Appears stated age Attitude/Demeanor/Rapport:    Affect (typically observed):  Accepting, Adaptable, Pleasant Orientation:  Oriented to Self, Oriented to Place, Oriented to  Time, Oriented to Situation Alcohol / Substance use:  Not Applicable Psych involvement (Current and /or in the community):  No (Comment)  Discharge Needs  Concerns to be addressed:  Discharge Planning Concerns Readmission within the last 30 days:  No Current discharge risk:  Dependent with Mobility Barriers to Discharge:  Continued Medical Work up   Colleen Freshwater, LCSW 08/18/2015, 11:34 AM

## 2015-08-18 NOTE — Evaluation (Signed)
Physical Therapy Evaluation Patient Details Name: Colleen Schaefer MRN: [id number] DOB: 1951-02-19 Today's Date: 08/18/2015   History of Present Illness  Venesa Tollefsen is a 64 y.o. female presented with a fall. She was walking a Retail banker and the dog started running. The patient still held onto the leash and did not let go. She fell to the ground and had pain in the right back and pelvis area. Initially the pain was 10 out of 10 intensity. Pt was found to have a R pubic rami fracture and L sacral fracture. One other fall reported in the last 12 months. Orthopedics has consulted and cleared patient for physical therapy.  Clinical Impression  Pt is primarily limited at this time due to R groin pain with bed mobility, transfers, and ambulation. PT evaluation was coordinated with pain medication administration but pt is still very limiting. In addition pt becomes dizzy during ambulation and when returned to sitting her BP dropped to 79/51. RN contacted and assessing pt at end of session. Pt was previously very active and has excellent potential to return to prior level of function. Currently she is only able to ambulate very limited distances due to pain and fatigue and will be unable to ascend/descend the 12 stairs necessary to enter/exit home. Her husband works during the day and she is home alone. Pt will need SNF placement in order to return to prior level of function at home. Pt will benefit from skilled PT services to address deficits in strength, balance, and mobility in order to return to full function at home. e    Follow Up Recommendations SNF    Equipment Recommendations  Rolling walker with 5" wheels    Recommendations for Other Services       Precautions / Restrictions Precautions Precautions: Fall Restrictions Weight Bearing Restrictions: No      Mobility  Bed Mobility Overal bed mobility: Needs Assistance Bed Mobility: Supine to Sit     Supine to sit: Mod  assist     General bed mobility comments: Pt limited due to pain with bed mobility. HOB elevated and overhead trapeze utilized. Pt requires assist to adduct RLE. Pain coming to full upright sitting but once upright at EOB pain decreases  Transfers Overall transfer level: Needs assistance Equipment used: Rolling walker (2 wheeled) Transfers: Sit to/from Stand Sit to Stand: Min assist         General transfer comment: Pt provided cues for safe hand placement. Requires minA+1 due to pain but once upright is steady and stable in standing.  Ambulation/Gait Ambulation/Gait assistance: Min guard Ambulation Distance (Feet): 12 Feet Assistive device: Rolling walker (2 wheeled) Gait Pattern/deviations: Step-to pattern;Antalgic Gait velocity: Decreased Gait velocity interpretation: <1.8 ft/sec, indicative of risk for recurrent falls General Gait Details: Pt provided cues for proper sequencing with walker. Mild decrease in weight shifting to RLE. Step-to pattern with increased fatigue and pain with increasing distance. Pt unable to ambulate farther at this time. Pt reports feeling lightheaded at end of therapy session. Returned to recliner and BP obtained which is 79/51. RN notified and legs elevated. BP slowly comes back up to 94/46.   Stairs            Wheelchair Mobility    Modified Rankin (Stroke Patients Only)       Balance Overall balance assessment: Needs assistance   Sitting balance-Leahy Scale: Good       Standing balance-Leahy Scale: Fair (Pt able to remove hands from walker for short period in  stan)                               Pertinent Vitals/Pain Pain Assessment: 0-10 Pain Score: 3  Pain Location: R groin Pain Descriptors / Indicators: Aching Pain Intervention(s): Limited activity within patient's tolerance;Premedicated before session;Monitored during session    [place] expects to be discharged to:: Private residence Living  Arrangements: Spouse/significant other Available Help at Discharge: Family Type of Home: Apartment (Second floor) Home Access: Stairs to enter Entrance Stairs-Rails: Left Entrance Stairs-Number of Steps: 12 Home Layout: One level Home Equipment: None Additional Comments: No BSC, cane, walker    Prior Function Level of Independence: Independent         Comments: Driving, full community ambulator     Hand Dominance   Dominant Hand: Right    Extremity/Trunk Assessment   Upper Extremity Assessment: Overall WFL for tasks assessed           Lower Extremity Assessment: RLE deficits/detail RLE Deficits / Details: LLE appears grossly WFL. R hip SLR decreased due to R grip pain. Strength in RLE appears grossly functional but limited due to pain       Communication   Communication: No difficulties  Cognition Arousal/Alertness: Awake/alert Behavior During Therapy: WFL for tasks assessed/performed Overall Cognitive Status: Within Functional Limits for tasks assessed                      General Comments      Exercises General Exercises - Lower Extremity Ankle Circles/Pumps: Strengthening;Both;10 reps;Supine Quad Sets: Strengthening;Both;10 reps;Supine Gluteal Sets: Strengthening;Both;10 reps;Supine Heel Slides: Strengthening;Both;10 reps;Supine Hip ABduction/ADduction: Strengthening;Both;10 reps;Supine Straight Leg Raises: Strengthening;Both;10 reps;Supine      Assessment/Plan    PT Assessment Patient needs continued PT services  PT Diagnosis Difficulty walking;Abnormality of gait;Generalized weakness;Acute pain   PT Problem List Decreased strength;Decreased range of motion;Decreased activity tolerance;Decreased balance;Pain  PT Treatment Interventions DME instruction;Gait training;Stair training;Therapeutic activities;Therapeutic exercise;Functional mobility training;Balance training;Neuromuscular re-education;Patient/family education;Manual techniques    PT Goals (Current goals can be found in the Care Plan section) Acute Rehab PT Goals Patient Stated Goal: To return to prior level of function PT Goal Formulation: With patient Time For Goal Achievement: 09/01/15 Potential to Achieve Goals: Good    Frequency 7X/week   Barriers to discharge Inaccessible home environment 12 stairs to enter second floor apartment    Co-evaluation               End of Session Equipment Utilized During Treatment: Gait belt Activity Tolerance: Patient tolerated treatment well Patient left: in chair;with call bell/phone within reach;with chair alarm set;with nursing/sitter in room Nurse Communication: Mobility status;Other (comment) (Low BP with ambulation)    Functional Assessment Tool Used: clinical judgement Functional Limitation: Mobility: Walking and moving around Mobility: Walking and Moving Around Current Status [phone]): At least 40 percent but less than 60 percent impaired, limited or restricted Mobility: Walking and Moving Around Goal Status [phone]): At least 1 percent but less than 20 percent impaired, limited or restricted    Time: 1023-1050 PT Time Calculation (min) (ACUTE ONLY): 27 min   Charges:   PT Evaluation $Initial PT Evaluation Tier I: 1 Procedure PT Treatments $Therapeutic Exercise: 8-22 mins   PT G Codes:   PT G-Codes **NOT FOR INPATIENT CLASS** Functional Assessment Tool Used: clinical judgement Functional Limitation: Mobility: Walking and moving around Mobility: Walking and Moving Around Current Status [id number]): At least 40 percent  but less than 60 percent impaired, limited or restricted Mobility: Walking and Moving Around Goal Status [phone]): At least 1 percent but less than 20 percent impaired, limited or restricted   Phillips Grout PT, DPT    Noura Purpura 08/18/2015, 11:20 AM

## 2015-08-18 NOTE — NC FL2 (Signed)
[place] LEVEL OF CARE SCREENING TOOL     IDENTIFICATION  Patient Name: Colleen Schaefer Birthdate: 1950/09/16 Sex: female Admission Date (Current Location): 08/17/2015  [place] and [place] Number:  [facility] )   Facility and Address:  [facility], [address], [place], [place] [address]      Provider Number: [id number]  Attending Physician Name and Address:  Fritzi Mandes, MD  Relative Name and Phone Number:       Current Level of Care: Hospital Recommended Level of Care: [place] Prior Approval Number:    Date Approved/Denied:   PASRR Number:  ([id number])  Discharge Plan: SNF    Current Diagnoses: Patient Active Problem List   Diagnosis Date Noted  . Fracture of sacrum ([place]) 08/17/2015    Orientation RESPIRATION BLADDER Height & Weight    Self, Time, Situation, Place  Normal Continent 5\' 3"  (160 cm) 130 lbs.  BEHAVIORAL SYMPTOMS/MOOD NEUROLOGICAL BOWEL NUTRITION STATUS   (none )  (none ) Continent Diet (Regular Diet )  AMBULATORY STATUS COMMUNICATION OF NEEDS Skin   Extensive Assist Verbally Normal                       Personal Care Assistance Level of Assistance  Bathing, Feeding, Dressing Bathing Assistance: Limited assistance Feeding assistance: Independent Dressing Assistance: Limited assistance     Functional Limitations Info  Sight, Hearing, Speech Sight Info: Adequate Hearing Info: Adequate Speech Info: Adequate    SPECIAL CARE FACTORS FREQUENCY  PT (By licensed PT), OT (By licensed OT)     PT Frequency:  (5) OT Frequency:  (5)            Contractures      Additional Factors Info  Code Status, Allergies Code Status Info:  (Full Code. ) Allergies Info:  (Morphine And Related)           Current Medications (08/18/2015):  This is the current hospital active medication list Current Facility-Administered Medications  Medication Dose Route Frequency Provider  Last Rate Last Dose  . acetaminophen (TYLENOL) tablet 650 mg  650 mg Oral Q6H PRN Loletha Grayer, MD       Or  . acetaminophen (TYLENOL) suppository 650 mg  650 mg Rectal Q6H PRN Loletha Grayer, MD      . cephALEXin (KEFLEX) capsule 500 mg  500 mg Oral Q12H Fritzi Mandes, MD   500 mg at 08/18/15 1043  . enoxaparin (LOVENOX) injection 40 mg  40 mg Subcutaneous Q24H Loletha Grayer, MD   40 mg at 08/17/15 1521  . escitalopram (LEXAPRO) tablet 10 mg  10 mg Oral Daily Loletha Grayer, MD   10 mg at 08/18/15 0912  . meloxicam (MOBIC) tablet 7.5 mg  7.5 mg Oral Daily Loletha Grayer, MD   7.5 mg at 08/18/15 0912  . nicotine (NICODERM CQ - dosed in mg/24 hr) patch 7 mg  7 mg Transdermal Daily Loletha Grayer, MD   7 mg at 08/18/15 0913  . ondansetron (ZOFRAN) tablet 4 mg  4 mg Oral Q6H PRN Loletha Grayer, MD   4 mg at 08/18/15 [id number]   Or  . ondansetron [facility]) injection 4 mg  4 mg Intravenous Q6H PRN Loletha Grayer, MD      . oxyCODONE-acetaminophen (PERCOCET/ROXICET) 5-325 MG per tablet 1 tablet  1 tablet Oral Q4H PRN Loletha Grayer, MD   1 tablet at 08/18/15 [id number]     Discharge Medications: Please see discharge summary for a list  of discharge medications.  Relevant Imaging Results:  Relevant Lab Results:   Additional Information  (SSN: [id number])  Loralyn Freshwater, LCSW

## 2015-08-18 NOTE — Progress Notes (Signed)
[facility] [place] NAME: Colleen Schaefer    MR#:  [id number]  DATE OF BIRTH:  1950-11-28  SUBJECTIVE:  Hip pain better today. Slept ok  overnite  REVIEW OF SYSTEMS:   Review of Systems  Constitutional: Negative for fever, chills and diaphoresis.  HENT: Negative for congestion, ear pain, hearing loss, nosebleeds and sore throat.   Eyes: Negative for blurred vision, double vision, photophobia and pain.  Respiratory: Negative for hemoptysis, sputum production, wheezing and stridor.   Cardiovascular: Negative for orthopnea, claudication and leg swelling.  Gastrointestinal: Negative for heartburn and abdominal pain.  Genitourinary: Positive for frequency. Negative for dysuria.  Musculoskeletal: Positive for back pain and joint pain. Negative for neck pain.  Skin: Negative for rash.  Neurological: Positive for weakness. Negative for tingling, sensory change, speech change, focal weakness, seizures and headaches.  Endo/Heme/Allergies: Does not bruise/bleed easily.  Psychiatric/Behavioral: Negative for suicidal ideas, memory loss and substance abuse. The patient is not nervous/anxious.    Tolerating Diet:yes Tolerating PT: pending  DRUG ALLERGIES:   Allergies  Allergen Reactions  . Morphine And Related Nausea And Vomiting    VITALS:  Blood pressure 93/45, pulse 67, temperature 98.2 F (36.8 C), temperature source Oral, resp. rate 18, height 5\' 3"  (1.6 m), weight 130 lb (58.968 kg), SpO2 93 %.  PHYSICAL EXAMINATION:   Physical Exam  GENERAL:  64 y.o.-year-old patient lying in the bed with no acute distress.  EYES: Pupils equal, round, reactive to light and accommodation. No scleral icterus. Extraocular muscles intact.  HEENT: Head atraumatic, normocephalic. Oropharynx and nasopharynx clear.  NECK:  Supple, no jugular venous distention. No thyroid enlargement, no tenderness.  LUNGS: Normal breath sounds bilaterally, no  wheezing, rales, rhonchi. No use of accessory muscles of respiration.  CARDIOVASCULAR: S1, S2 normal. No murmurs, rubs, or gallops.  ABDOMEN: Soft, nontender, nondistended. Bowel sounds present. No organomegaly or mass.  EXTREMITIES: No cyanosis, clubbing or edema b/l.    NEUROLOGIC: Cranial nerves II through XII are intact. No focal Motor or sensory deficits b/l.   PSYCHIATRIC: The patient is alert and oriented x 3.  SKIN: No obvious rash, lesion, or ulcer.    LABORATORY PANEL:   CBC  Recent Labs Lab 08/18/15 0650  WBC 9.1  HGB 11.0*  HCT 33.0*  PLT 202    Chemistries   Recent Labs Lab 08/17/15 0859 08/18/15 0650  NA 140 138  K 3.9 3.8  CL 107 108  CO2 29 26  GLUCOSE 88 102*  BUN 24* 19  CREATININE 0.90 0.70  CALCIUM 8.8* 8.0*  AST 21  --   ALT 16  --   ALKPHOS 83  --   BILITOT 0.3  --     Cardiac Enzymes No results for input(s): TROPONINI in the last 168 hours.  RADIOLOGY:  Ct Pelvis Wo Contrast  08/17/2015  CLINICAL DATA:  Fall while walking dog. Pain on right hip and groin region. EXAM: CT PELVIS WITHOUT CONTRAST TECHNIQUE: Multidetector CT imaging of the pelvis was performed following the standard protocol without intravenous contrast. COMPARISON:  Plain films earlier today FINDINGS: There are fractures through the right sacral ala. No definite left sacral fracture. Fractures are noted through the right superior and inferior pubic rami. No femoral neck fracture. There is sigmoid diverticulosis. No active diverticulitis. Small amount of stranding is noted along the anterior bladder surface adjacent to the right pubic bone fractures compatible with small extraperitoneal hematoma. IMPRESSION: Fracture through the  right sacrum, right superior and inferior pubic rami. Small right extraperitoneal hematoma adjacent to the right anterior bladder wall. Electronically Signed   By: Rolm Baptise M.D.   On: 08/17/2015 10:21   Dg Hip Unilat With Pelvis 2-3 Views  Right  08/17/2015  CLINICAL DATA:  Right hip pain after falling while walking dog. Initial encounter. EXAM: DG HIP (WITH OR WITHOUT PELVIS) 2-3V RIGHT COMPARISON:  None. FINDINGS: The bones are demineralized. There are comminuted and mildly displaced fractures of the right superior and inferior pubic rami. No acetabular extension identified. There is no evidence of proximal femur fracture or dislocation. The sacroiliac joints and symphysis pubis appear intact. However, there is subtle irregularity of the sacral foramina on the right and vertical lucency within the sacrum on the left, suspicious for nondisplaced bilateral sacral fractures. IMPRESSION: 1. Comminuted mildly displaced fractures of the right superior and inferior pubic rami. 2. Possible nondisplaced sacral fractures bilaterally. 3. No evidence of proximal femur fracture or dislocation. Electronically Signed   By: Richardean Sale M.D.   On: 08/17/2015 08:46    ASSESSMENT AND PLAN:   Colleen Schaefer is a 64 y.o. female presented with a fall. She was walking a Retail banker and the dog started running. The patient still held onto the leash and did not let go. She fell to the ground and had pain in the right back and pelvis area  1. Right sacral fracture, right pubic rami fracture superior and inferior.  - orthopedic consult noted. OK to start and physical therapy - Pain control with oral Percocet. Try to avoid IV dilaudid since her blood pressure and pulse ox dropped down with this medication.  2. Relative hypotension Likely secondary to IV pain medications -much better today  3. Anxiety and depression- continue Lexapro  4. Tobacco abuse- smoking cessation counseling done 3 minutes by me and nicotine patch applied.  5.Dysuria with abnormal UA Start po keflex  Seen by PT. Recommends rehab. CSW for SNF  Case discussed with Care Management/Social Worker. Management plans discussed with the patient, family and they are in  agreement.  CODE STATUS: full  DVT Prophylaxis:lovenox  TOTAL TIME TAKING CARE OF THIS PATIENT: 35 minutes.  >50% time spent on counselling and coordination of care  POSSIBLE D/C IN1-2DAYS, DEPENDING ON CLINICAL CONDITION.   Alfie Rideaux M.D on 08/18/2015 at 11:42 AM  Between 7am to 6pm - Pager - [phone]  After 6pm go to [email or web address] - password [email or web address] [facility]  Office  [phone]  CC: Primary care physician; Lynnell Jude, MD

## 2015-08-18 NOTE — Progress Notes (Signed)
Clinical Education officer, museum (CSW) presented bed offers to patient. She chose [facility]. Joann admissions coordinator at [facility] is aware of accepted offer. Patient will need a 3 night qualifying inpatient stay. Patient is inpatient as of 08/17/15. CSW will continue to follow and assist as needed.   Blima Rich, [place] [phone]

## 2015-08-18 NOTE — Progress Notes (Signed)
Spoke with Dr.Raylynne Cubbage regarding patient urine result and trouble with voiding. MD to place new orders.

## 2015-08-19 LAB — URINE CULTURE
Culture: >=100000
Special Requests: NORMAL

## 2015-08-19 NOTE — Care Management Important Message (Signed)
Important Message  Patient Details  Name: Colleen Schaefer MRN: [id number] Date of Birth: 08-Feb-1951   Medicare Important Message Given:  Yes    [name] 08/19/2015, 1:59 PM

## 2015-08-19 NOTE — Progress Notes (Signed)
[facility] [place] NAME: Colleen Schaefer    MR#:  [id number]  DATE OF BIRTH:  [birth year]  SUBJECTIVE:  Hip pain better today. Slept ok  Overnite. Out in the recliner  REVIEW OF SYSTEMS:   Review of Systems  Constitutional: Negative for fever, chills and diaphoresis.  HENT: Negative for congestion, ear pain, hearing loss, nosebleeds and sore throat.   Eyes: Negative for blurred vision, double vision, photophobia and pain.  Respiratory: Negative for hemoptysis, sputum production, wheezing and stridor.   Cardiovascular: Negative for orthopnea, claudication and leg swelling.  Gastrointestinal: Negative for heartburn and abdominal pain.  Genitourinary: Positive for frequency. Negative for dysuria.  Musculoskeletal: Positive for back pain and joint pain. Negative for neck pain.  Skin: Negative for rash.  Neurological: Positive for weakness. Negative for tingling, sensory change, speech change, focal weakness, seizures and headaches.  Endo/Heme/Allergies: Does not bruise/bleed easily.  Psychiatric/Behavioral: Negative for suicidal ideas, memory loss and substance abuse. The patient is not nervous/anxious.    Tolerating Diet:yes Tolerating PT: pending  DRUG ALLERGIES:   Allergies  Allergen Reactions  . Morphine And Related Nausea And Vomiting    VITALS:  Blood pressure 138/51, pulse 70, temperature 99 F (37.2 C), temperature source Oral, resp. rate 18, height 5\' 3"  (1.6 m), weight 130 lb (58.968 kg), SpO2 93 %.  PHYSICAL EXAMINATION:   Physical Exam  GENERAL:  64 y.o.-year-old patient lying in the bed with no acute distress.  EYES: Pupils equal, round, reactive to light and accommodation. No scleral icterus. Extraocular muscles intact.  HEENT: Head atraumatic, normocephalic. Oropharynx and nasopharynx clear.  NECK:  Supple, no jugular venous distention. No thyroid enlargement, no tenderness.  LUNGS: Normal breath sounds  bilaterally, no wheezing, rales, rhonchi. No use of accessory muscles of respiration.  CARDIOVASCULAR: S1, S2 normal. No murmurs, rubs, or gallops.  ABDOMEN: Soft, nontender, nondistended. Bowel sounds present. No organomegaly or mass.  EXTREMITIES: No cyanosis, clubbing or edema b/l.    NEUROLOGIC: Cranial nerves II through XII are intact. No focal Motor or sensory deficits b/l.   PSYCHIATRIC: The patient is alert and oriented x 3.  SKIN: No obvious rash, lesion, or ulcer.    LABORATORY PANEL:   CBC  Recent Labs Lab 08/18/15 0650  WBC 9.1  HGB 11.0*  HCT 33.0*  PLT 202    Chemistries   Recent Labs Lab 08/17/15 0859 08/18/15 0650  NA 140 138  K 3.9 3.8  CL 107 108  CO2 29 26  GLUCOSE 88 102*  BUN 24* 19  CREATININE 0.90 0.70  CALCIUM 8.8* 8.0*  AST 21  --   ALT 16  --   ALKPHOS 83  --   BILITOT 0.3  --     Cardiac Enzymes No results for input(s): TROPONINI in the last 168 hours.  RADIOLOGY:  No results found.  ASSESSMENT AND PLAN:   Colleen Schaefer is a 64 y.o. female presented with a fall. She was walking a Retail banker and the dog started running. The patient still held onto the leash and did not let go. She fell to the ground and had pain in the right back and pelvis area  1. Right sacral fracture, right pubic rami fracture superior and inferior.  - orthopedic consult noted. OK to start and physical therapy - Pain control with oral Percocet  2. Relative hypotension Likely secondary to IV pain medications -much better today  3. Anxiety and depression- continue Lexapro  4. Tobacco abuse- smoking cessation counseling done 3 minutes by me and nicotine patch applied.  5.Dysuria with abnormal UA Started po keflex  Seen by PT. Recommends rehab. CSW for SNF  Case discussed with Care Management/Social Worker. Management plans discussed with the patient, family and they are in agreement.  CODE STATUS: full  DVT Prophylaxis:lovenox  TOTAL  TIME TAKING CARE OF THIS PATIENT: 35 minutes.  >50% time spent on counselling and coordination of care  POSSIBLE D/C IN AM DEPENDING ON CLINICAL CONDITION.   Halley Shepheard M.D on 08/19/2015 at 12:20 PM  Between 7am to 6pm - Pager - [phone]  After 6pm go to [email or web address] - password [email or web address] [facility]  Office  [phone]  CC: Primary care physician; Lynnell Jude, MD

## 2015-08-19 NOTE — Progress Notes (Signed)
Physical Therapy Treatment Patient Details Name: Colleen Schaefer MRN: [id number] DOB: Apr 28, 1951 Today's Date: 08/19/2015    History of Present Illness Colleen Schaefer is a 64 y.o. female presented with a fall. She was walking a Retail banker and the dog started running. The patient still held onto the leash and did not let go. She fell to the ground and had pain in the right back and pelvis area. Initially the pain was 10 out of 10 intensity. Pt was found to have a R pubic rami fracture and L sacral fracture. One other fall reported in the last 12 months. Orthopedics has consulted and cleared patient for physical therapy.    PT Comments    Pt progressing all areas of active range of motion strength, transfers and ambulation. Pain controlled with medication, but continues limiting. Continue PT to progress quality of ambulation and distance and all functional mobility.   Follow Up Recommendations  SNF     Equipment Recommendations  Rolling walker with 5" wheels    Recommendations for Other Services       Precautions / Restrictions Precautions Precautions: Fall Restrictions Weight Bearing Restrictions: No    Mobility  Bed Mobility               General bed mobility comments: not tested; up in chair  Transfers Overall transfer level: Needs assistance Equipment used: Rolling walker (2 wheeled) Transfers: Sit to/from Stand Sit to Stand: Min guard         General transfer comment: slow to rise/sit due to pain; good hand placement and no physical assist required  Ambulation/Gait Ambulation/Gait assistance: Min guard Ambulation Distance (Feet): 50 Feet Assistive device: Rolling walker (2 wheeled) Gait Pattern/deviations: Step-through pattern;Antalgic;Trunk flexed;Wide base of support Gait velocity: Decreased Gait velocity interpretation: Below normal speed for age/gender General Gait Details: Cautious/guarded stiff legged ambulation   Stairs             Wheelchair Mobility    Modified Rankin (Stroke Patients Only)       Balance                                    Cognition Arousal/Alertness: Awake/alert Behavior During Therapy: WFL for tasks assessed/performed Overall Cognitive Status: Within Functional Limits for tasks assessed                      Exercises General Exercises - Lower Extremity Ankle Circles/Pumps: AROM;Both;20 reps (long sit) Quad Sets: Strengthening;Both;20 reps (long sit) Gluteal Sets: Strengthening;Both;20 reps (long sit) Long Arc Quad: AROM;Both;20 reps;Seated Heel Slides: AROM;Both;20 reps;AAROM (long sit; AAROM initially on R) Hip ABduction/ADduction: AROM;Both;20 reps;AAROM (long sit; R held to prevent friction resist) Hip Flexion/Marching: AROM;Both;20 reps;Seated Other Exercises Other Exercises: adductor squeeze 20x long sit    General Comments        Pertinent Vitals/Pain Pain Assessment: 0-10 Pain Score: 2  Pain Location: Primarily groin; little in back Pain Descriptors / Indicators: Aching;Constant Pain Intervention(s): Limited activity within patient's tolerance;Monitored during session;Premedicated before session    Home Living                      Prior Function            PT Goals (current goals can now be found in the care plan section) Progress towards PT goals: Progressing toward goals    Frequency  7X/week    PT  Plan Current plan remains appropriate    Co-evaluation             End of Session Equipment Utilized During Treatment: Gait belt Activity Tolerance: Patient tolerated treatment well;Patient limited by pain Patient left: in chair;with call bell/phone within reach;with chair alarm set     Time: [phone] PT Time Calculation (min) (ACUTE ONLY): 39 min  Charges:  $Gait Training: 8-22 mins $Therapeutic Exercise: 23-37 mins                    G Codes:      Colleen Schaefer 08/19/2015, 9:56 AM

## 2015-08-19 NOTE — Progress Notes (Signed)
Plan is for patient to D/C to [facility] tomorrow pending medical clearance. Joann admissions coordinator at [facility] is aware of above. Clinical Social Worker (CSW) will continue to follow and assist as needed.   Blima Rich, [place] [phone]

## 2015-08-19 NOTE — Progress Notes (Signed)
Patient's up and ambulating in hallway with PT.  

## 2015-08-20 MED ORDER — OXYCODONE-ACETAMINOPHEN 5-325 MG PO TABS: 1.0000 | ORAL_TABLET | ORAL | Status: DC | PRN

## 2015-08-20 MED ORDER — CEPHALEXIN 500 MG PO CAPS: 500.0000 mg | ORAL_CAPSULE | Freq: Two times a day (BID) | ORAL | Status: DC

## 2015-08-20 MED ORDER — ENOXAPARIN SODIUM 40 MG/0.4ML ~~LOC~~ SOLN: 40.0000 mg | SUBCUTANEOUS | Status: DC

## 2015-08-20 MED ORDER — BISACODYL 10 MG RE SUPP
10.0000 mg | Freq: Every day | RECTAL | Status: DC | PRN
  Administered 2015-08-20: 10 mg via RECTAL
  Filled 2015-08-20: qty 1

## 2015-08-20 MED ORDER — FLEET ENEMA 7-19 GM/118ML RE ENEM: 1.0000 | ENEMA | Freq: Every day | RECTAL | Status: DC | PRN

## 2015-08-20 NOTE — Clinical Social Work Placement (Signed)
   CLINICAL SOCIAL WORK PLACEMENT  NOTE  Date:  08/20/2015  Patient Details  Name: Colleen Schaefer MRN: [id number] Date of Birth: May 12, 1951  Clinical Social Work is seeking post-discharge placement for this patient at the [place] level of care (*CSW will initial, date and re-position this form in  chart as items are completed):  Yes   Patient/family provided with [place] Work Department's list of facilities offering this level of care within the geographic area requested by the patient (or if unable, by the patient's family).  Yes   Patient/family informed of their freedom to choose among providers that offer the needed level of care, that participate in Medicare, Medicaid or managed care program needed by the patient, have an available bed and are willing to accept the patient.  Yes   Patient/family informed of [facility]'s ownership interest in [facility], as well as of the fact that they are under no obligation to receive care at these facilities.  PASRR submitted to EDS on 08/18/15     PASRR number received on 08/18/15     Existing PASRR number confirmed on       FL2 transmitted to all facilities in geographic area requested by pt/family on 08/18/15     FL2 transmitted to all facilities within larger geographic area on       Patient informed that his/her managed care company has contracts with or will negotiate with certain facilities, including the following:        Yes   Patient/family informed of bed offers received.  Patient chooses bed at  [facility])     Physician recommends and patient chooses bed at      Patient to be transferred to  [facility] ) on 08/20/15.  Patient to be transferred to facility by  (Husband will provide transport. )     Patient family notified on 08/20/15 of transfer.  Name of family member notified:   (Per patient she will call her husband and make him aware. )      PHYSICIAN       Additional Comment:    _______________________________________________ Loralyn [place], LCSW 08/20/2015, 10:35 AM

## 2015-08-20 NOTE — Progress Notes (Signed)
Patient is medically stable for D/C to [facility] today. Per [facility] admissions coordinator at [facility] patient is going to room E-9. RN will call report. Per patient her husband will provide transport. Clinical Education officer, museum (CSW) sent D/C Summary, D/C packet and FL2 to [facility] via [facility]. Patient is aware of above. Patient reported that she would call her husband and he would provide transport. Please reconsult if future social work needs arise. CSW signing off.   Blima Rich, [place] [phone]

## 2015-08-20 NOTE — Progress Notes (Signed)
Report called to nursing at [facility]  Pt discharged  Via wheelchair being transported to [place] , paperwork given to pt to give to nurse

## 2015-08-20 NOTE — Discharge Summary (Signed)
[facility] [place] NAME: Colleen Schaefer    MR#:  [id number]  DATE OF BIRTH:  1950/12/17  DATE OF ADMISSION:  08/17/2015 ADMITTING PHYSICIAN: Loletha Grayer, MD  DATE OF DISCHARGE: 08/20/15  PRIMARY CARE PHYSICIAN: Schaefer, Colleen Jude, MD    ADMISSION DIAGNOSIS:  Pelvic fracture, closed, initial encounter [S32.9XXA]  DISCHARGE DIAGNOSIS:  Right sacral fracture, right pubic rami fracture superior and inferior. Ecoli UTI  SECONDARY DIAGNOSIS:   Past Medical History  Diagnosis Date  . Anxiety   . Arthritis   . Hypertension     HOSPITAL COURSE:   Colleen Schaefer is a 64 y.o. female presented with a fall. She was walking a Retail banker and the dog started running. The patient still held onto the leash and did not let go. She fell to the ground and had pain in the right back and pelvis area  1. Right sacral fracture, right pubic rami fracture superior and inferior.  - orthopedic consult noted. Pt tolerating physical therapy - Pain control with oral Percocet.   2. Relative hypotension Likely secondary to IV pain medications-resolved  3. Anxiety and depression- continue Lexapro  4. Tobacco abuse- smoking cessation counseling done 3 minutes by me and nicotine patch applied.  5.Ecoli uti on po keflex-7 days  To rehab today  CONSULTS OBTAINED:  Treatment Team:  Hessie Knows, MD  DRUG ALLERGIES:   Allergies  Allergen Reactions  . Morphine And Related Nausea And Vomiting    DISCHARGE MEDICATIONS:   Current Discharge Medication List    START taking these medications   Details  cephALEXin (KEFLEX) 500 MG capsule Take 1 capsule (500 mg total) by mouth every 12 (twelve) hours. Qty: 10 capsule, Refills: 0    enoxaparin (LOVENOX) 40 MG/0.4ML injection Inject 0.4 mLs (40 mg total) into the skin daily. Qty: 14 Syringe, Refills: 0    oxyCODONE-acetaminophen (PERCOCET/ROXICET) 5-325 MG tablet Take 1 tablet by  mouth every 4 (four) hours as needed for moderate pain or severe pain. Qty: 30 tablet, Refills: 0      CONTINUE these medications which have NOT CHANGED   Details  escitalopram (LEXAPRO) 10 MG tablet Take 10 mg by mouth daily.    meloxicam (MOBIC) 7.5 MG tablet Take 7.5 mg by mouth daily.        If you experience worsening of your admission symptoms, develop shortness of breath, life threatening emergency, suicidal or homicidal thoughts you must seek medical attention immediately by calling 911 or calling your MD immediately  if symptoms less severe.  You Must read complete instructions/literature along with all the possible adverse reactions/side effects for all the Medicines you take and that have been prescribed to you. Take any new Medicines after you have completely understood and accept all the possible adverse reactions/side effects.   Please note  You were cared for by a hospitalist during your hospital stay. If you have any questions about your discharge medications or the care you received while you were in the hospital after you are discharged, you can call the unit and asked to speak with the hospitalist on call if the hospitalist that took care of you is not available. Once you are discharged, your primary care physician will handle any further medical issues. Please note that NO REFILLS for any discharge medications will be authorized once you are discharged, as it is imperative that you return to your primary care physician (or establish a relationship with a primary care  physician if you do not have one) for your aftercare needs so that they can reassess your need for medications and monitor your lab values. Today   SUBJECTIVE   No complaints  VITAL SIGNS:  Blood pressure 128/64, pulse 79, temperature 99.2 F (37.3 C), temperature source Oral, resp. rate 18, height 5\' 3"  (1.6 m), weight 130 lb (58.968 kg), SpO2 95 %.  I/O:   Intake/Output Summary (Last 24 hours) at  08/20/15 0720 Last data filed at 08/20/15 0419  Gross per 24 hour  Intake    720 ml  Output    700 ml  Net     20 ml    PHYSICAL EXAMINATION:  GENERAL:  64 y.o.-year-old patient lying in the bed with no acute distress.  EYES: Pupils equal, round, reactive to light and accommodation. No scleral icterus. Extraocular muscles intact.  HEENT: Head atraumatic, normocephalic. Oropharynx and nasopharynx clear.  NECK:  Supple, no jugular venous distention. No thyroid enlargement, no tenderness.  LUNGS: Normal breath sounds bilaterally, no wheezing, rales,rhonchi or crepitation. No use of accessory muscles of respiration.  CARDIOVASCULAR: S1, S2 normal. No murmurs, rubs, or gallops.  ABDOMEN: Soft, non-tender, non-distended. Bowel sounds present. No organomegaly or mass.  EXTREMITIES: No pedal edema, cyanosis, or clubbing.  NEUROLOGIC: Cranial nerves II through XII are intact. Muscle strength 5/5 in all extremities. Sensation intact. Gait not checked.  PSYCHIATRIC: The patient is alert and oriented x 3.  SKIN: No obvious rash, lesion, or ulcer.   DATA REVIEW:   CBC   Recent Labs Lab 08/18/15 0650  WBC 9.1  HGB 11.0*  HCT 33.0*  PLT 202    Chemistries   Recent Labs Lab 08/17/15 0859 08/18/15 0650  NA 140 138  K 3.9 3.8  CL 107 108  CO2 29 26  GLUCOSE 88 102*  BUN 24* 19  CREATININE 0.90 0.70  CALCIUM 8.8* 8.0*  AST 21  --   ALT 16  --   ALKPHOS 83  --   BILITOT 0.3  --     Microbiology Results   Recent Results (from the past 240 hour(s))  Urine culture     Status: None   Collection Time: 08/17/15  3:25 PM  Result Value Ref Range Status   Specimen Description URINE, CLEAN CATCH  Final   Special Requests Normal  Final   Culture >=100,000 COLONIES/mL ESCHERICHIA COLI  Final   Report Status 08/19/2015 FINAL  Final   Organism ID, Bacteria ESCHERICHIA COLI  Final      Susceptibility   Escherichia coli - MIC*    AMPICILLIN >=32 RESISTANT Resistant     CEFAZOLIN 8  SENSITIVE Sensitive     CEFTRIAXONE <=1 SENSITIVE Sensitive     CIPROFLOXACIN <=0.25 SENSITIVE Sensitive     GENTAMICIN <=1 SENSITIVE Sensitive     IMIPENEM <=0.25 SENSITIVE Sensitive     NITROFURANTOIN <=16 SENSITIVE Sensitive     TRIMETH/SULFA <=20 SENSITIVE Sensitive     PIP/TAZO Value in next row Sensitive      SENSITIVE8    * >=100,000 COLONIES/mL ESCHERICHIA COLI    RADIOLOGY:  No results found.   Management plans discussed with the patient, family and they are in agreement.  CODE STATUS:     Code Status Orders        Start     Ordered   08/17/15 1227  Full code   Continuous     08/17/15 1227      TOTAL TIME TAKING CARE OF  THIS PATIENT: 40 minutes.    Ksenia Kunz M.D on 08/20/2015 at 7:20 AM  Between 7am to 6pm - Pager - [phone] After 6pm go to [email or web address] - password [email or web address] [facility]  Office  [phone]  CC: Primary care physician; Colleen Jude, MD

## 2015-08-20 NOTE — Progress Notes (Signed)
Physical Therapy Treatment Patient Details Name: Colleen Schaefer MRN: [id number] DOB: Mar 20, 1951 Today's Date: 08/20/2015    History of Present Illness Colleen Schaefer is a 64 y.o. female presented with a fall. She was walking a Retail banker and the dog started running. The patient still held onto the leash and did not let go. She fell to the ground and had pain in the right back and pelvis area. Initially the pain was 10 out of 10 intensity. Pt was found to have a R pubic rami fracture and L sacral fracture. One other fall reported in the last 12 months. Orthopedics has consulted and cleared patient for physical therapy.    PT Comments    Pt doing better with mobility this session and is able to get in and out of bed with SBA.  Pt still needs extra time for all movement and experiences increased pain with mobility, especially in and out of bed.  Pt with good recall of therex from previous session.  Walker height adjusted and reviewed car transfer for impending discharge to SNF with transportation from husband in personal vehicle.  Follow Up Recommendations  SNF     Equipment Recommendations  Rolling walker with 5" wheels    Recommendations for Other Services       Precautions / Restrictions Precautions Precautions: Fall    Mobility  Bed Mobility              General bed mobility comments: Supine<>sit with CGA exiting to L side of bed for comfort  Transfers                 General transfer comment: Sit<>stand with SBA, extra time and effort to stand due to pain; B hands on RW to stand, one hand on bed rail with sitting  Ambulation/Gait Ambulation/Gait assistance: Supervision Ambulation Distance (Feet): 55 Feet Assistive device: Rolling walker (2 wheeled) Gait Pattern/deviations: Step-through pattern;Antalgic     General Gait Details: Decreased cadence with good awareness and safety with walker.   Stairs            Wheelchair Mobility     Modified Rankin (Stroke Patients Only)       Balance                                    Cognition Arousal/Alertness: Awake/alert Behavior During Therapy: WFL for tasks assessed/performed Overall Cognitive Status: Within Functional Limits for tasks assessed                      Exercises General Exercises - Lower Extremity Ankle Circles/Pumps: AROM;Both;20 reps Quad Sets: Strengthening;Both;20 reps Gluteal Sets: Strengthening;Both;20 reps Heel Slides: AROM;Both;20 reps;AAROM Hip ABduction/ADduction: AROM;Both;20 reps;AAROM    General Comments        Pertinent Vitals/Pain Pain Assessment: 0-10 Pain Score: 5  (With walking around and getting up out of bed.)    Home Living                      Prior Function            PT Goals (current goals can now be found in the care plan section) Progress towards PT goals: Progressing toward goals    Frequency  7X/week    PT Plan Current plan remains appropriate    Co-evaluation             End of Session  Equipment Utilized During Treatment: Gait belt Activity Tolerance: Patient tolerated treatment well;Patient limited by pain Patient left: in bed;with call bell/phone within reach     Time: 1010-1035 PT Time Calculation (min) (ACUTE ONLY): 25 min  Charges:  $Gait Training: 8-22 mins $Therapeutic Exercise: 8-22 mins                    G Codes:      Colleen Schaefer A Colleen Schaefer 2015/09/15, 10:48 AM

## 2015-08-20 NOTE — NC FL2 (Signed)
[place] LEVEL OF CARE SCREENING TOOL     IDENTIFICATION  Patient Name: Colleen Schaefer Birthdate: 08-Mar-1951 Sex: female Admission Date (Current Location): 08/17/2015  [place] and [place] Number:  [facility] )   Facility and Address:  [facility], [address], [place], [place] [address]      Provider Number: [id number]  Attending Physician Name and Address:  Fritzi Mandes, MD  Relative Name and Phone Number:       Current Level of Care: Hospital Recommended Level of Care: [place] Prior Approval Number:    Date Approved/Denied:   PASRR Number:  ([id number])  Discharge Plan: SNF    Current Diagnoses: Patient Active Problem List   Diagnosis Date Noted  . Fracture of sacrum ([place]) 08/17/2015    Orientation RESPIRATION BLADDER Height & Weight    Self, Time, Situation, Place  Normal Continent 5\' 3"  (160 cm) 130 lbs.  BEHAVIORAL SYMPTOMS/MOOD NEUROLOGICAL BOWEL NUTRITION STATUS   (none )  (none ) Continent Diet (Regular Diet )  AMBULATORY STATUS COMMUNICATION OF NEEDS Skin   Extensive Assist Verbally Normal                       Personal Care Assistance Level of Assistance  Bathing, Feeding, Dressing Bathing Assistance: Limited assistance Feeding assistance: Independent Dressing Assistance: Limited assistance     Functional Limitations Info  Sight, Hearing, Speech Sight Info: Adequate Hearing Info: Adequate Speech Info: Adequate    SPECIAL CARE FACTORS FREQUENCY  PT (By licensed PT), OT (By licensed OT)     PT Frequency:  (5) OT Frequency:  (5)            Contractures      Additional Factors Info  Code Status, Allergies Code Status Info:  (Full Code. ) Allergies Info:  (Morphine And Related)           Current Medications (08/20/2015):  This is the current hospital active medication list Current Facility-Administered Medications  Medication Dose Route Frequency Provider  Last Rate Last Dose  . acetaminophen (TYLENOL) tablet 650 mg  650 mg Oral Q6H PRN Loletha Grayer, MD       Or  . acetaminophen (TYLENOL) suppository 650 mg  650 mg Rectal Q6H PRN Loletha Grayer, MD      . bisacodyl (DULCOLAX) suppository 10 mg  10 mg Rectal Daily PRN Fritzi Mandes, MD      . cephALEXin (KEFLEX) capsule 500 mg  500 mg Oral Q12H Fritzi Mandes, MD   500 mg at 08/19/15 2157  . enoxaparin (LOVENOX) injection 40 mg  40 mg Subcutaneous Q24H Loletha Grayer, MD   40 mg at 08/19/15 1453  . escitalopram (LEXAPRO) tablet 10 mg  10 mg Oral Daily Loletha Grayer, MD   10 mg at 08/18/15 0912  . meloxicam (MOBIC) tablet 7.5 mg  7.5 mg Oral Daily Loletha Grayer, MD   7.5 mg at 08/18/15 0912  . nicotine (NICODERM CQ - dosed in mg/24 hr) patch 7 mg  7 mg Transdermal Daily Loletha Grayer, MD   7 mg at 08/18/15 0913  . ondansetron (ZOFRAN) tablet 4 mg  4 mg Oral Q6H PRN Loletha Grayer, MD   4 mg at 08/18/15 [id number]   Or  . ondansetron [facility]) injection 4 mg  4 mg Intravenous Q6H PRN Loletha Grayer, MD      . oxyCODONE-acetaminophen (PERCOCET/ROXICET) 5-325 MG per tablet 1 tablet  1 tablet Oral Q4H PRN Loletha Grayer, MD  1 tablet at 08/20/15 0432  . sodium phosphate (FLEET) 7-19 GM/118ML enema 1 enema  1 enema Rectal Daily PRN Fritzi Mandes, MD         Discharge Medications: Please see discharge summary for a list of discharge medications.  Relevant Imaging Results:  Relevant Lab Results:   Additional Information  (SSN: [id number])  Loralyn Freshwater, LCSW

## 2015-08-20 NOTE — Progress Notes (Signed)
Patient needs to have BM before discharge.  Orders received

## 2015-09-06 DIAGNOSIS — S3289XA Fracture of other parts of pelvis, initial encounter for closed fracture: Secondary | ICD-10-CM | POA: Diagnosis not present

## 2015-09-06 DIAGNOSIS — M545 Low back pain: Secondary | ICD-10-CM | POA: Diagnosis not present

## 2015-09-08 DIAGNOSIS — M545 Low back pain: Secondary | ICD-10-CM | POA: Diagnosis not present

## 2015-09-08 DIAGNOSIS — S3289XA Fracture of other parts of pelvis, initial encounter for closed fracture: Secondary | ICD-10-CM | POA: Diagnosis not present

## 2015-09-10 DIAGNOSIS — M545 Low back pain: Secondary | ICD-10-CM | POA: Diagnosis not present

## 2015-09-10 DIAGNOSIS — S3289XA Fracture of other parts of pelvis, initial encounter for closed fracture: Secondary | ICD-10-CM | POA: Diagnosis not present

## 2015-09-17 DIAGNOSIS — S3289XA Fracture of other parts of pelvis, initial encounter for closed fracture: Secondary | ICD-10-CM | POA: Diagnosis not present

## 2015-09-17 DIAGNOSIS — M545 Low back pain: Secondary | ICD-10-CM | POA: Diagnosis not present

## 2015-09-22 DIAGNOSIS — M545 Low back pain: Secondary | ICD-10-CM | POA: Diagnosis not present

## 2015-09-22 DIAGNOSIS — S3289XA Fracture of other parts of pelvis, initial encounter for closed fracture: Secondary | ICD-10-CM | POA: Diagnosis not present

## 2015-09-28 DIAGNOSIS — M545 Low back pain: Secondary | ICD-10-CM | POA: Diagnosis not present

## 2015-09-28 DIAGNOSIS — S3289XA Fracture of other parts of pelvis, initial encounter for closed fracture: Secondary | ICD-10-CM | POA: Diagnosis not present

## 2015-09-30 DIAGNOSIS — S3289XA Fracture of other parts of pelvis, initial encounter for closed fracture: Secondary | ICD-10-CM | POA: Diagnosis not present

## 2015-09-30 DIAGNOSIS — M545 Low back pain: Secondary | ICD-10-CM | POA: Diagnosis not present

## 2015-10-05 DIAGNOSIS — M545 Low back pain: Secondary | ICD-10-CM | POA: Diagnosis not present

## 2015-10-05 DIAGNOSIS — S3289XA Fracture of other parts of pelvis, initial encounter for closed fracture: Secondary | ICD-10-CM | POA: Diagnosis not present

## 2015-10-07 DIAGNOSIS — S3289XA Fracture of other parts of pelvis, initial encounter for closed fracture: Secondary | ICD-10-CM | POA: Diagnosis not present

## 2015-10-07 DIAGNOSIS — M545 Low back pain: Secondary | ICD-10-CM | POA: Diagnosis not present

## 2015-10-12 DIAGNOSIS — S3289XA Fracture of other parts of pelvis, initial encounter for closed fracture: Secondary | ICD-10-CM | POA: Diagnosis not present

## 2015-10-12 DIAGNOSIS — M545 Low back pain: Secondary | ICD-10-CM | POA: Diagnosis not present

## 2015-10-14 DIAGNOSIS — M545 Low back pain: Secondary | ICD-10-CM | POA: Diagnosis not present

## 2015-10-14 DIAGNOSIS — S3289XA Fracture of other parts of pelvis, initial encounter for closed fracture: Secondary | ICD-10-CM | POA: Diagnosis not present

## 2015-10-15 DIAGNOSIS — R102 Pelvic and perineal pain: Secondary | ICD-10-CM | POA: Diagnosis not present

## 2015-10-15 DIAGNOSIS — S32810D Multiple fractures of pelvis with stable disruption of pelvic ring, subsequent encounter for fracture with routine healing: Secondary | ICD-10-CM | POA: Diagnosis not present

## 2015-10-20 DIAGNOSIS — M545 Low back pain: Secondary | ICD-10-CM | POA: Diagnosis not present

## 2015-10-20 DIAGNOSIS — S3289XA Fracture of other parts of pelvis, initial encounter for closed fracture: Secondary | ICD-10-CM | POA: Diagnosis not present

## 2015-10-22 DIAGNOSIS — S3289XA Fracture of other parts of pelvis, initial encounter for closed fracture: Secondary | ICD-10-CM | POA: Diagnosis not present

## 2015-10-22 DIAGNOSIS — M545 Low back pain: Secondary | ICD-10-CM | POA: Diagnosis not present

## 2015-10-28 DIAGNOSIS — S3289XA Fracture of other parts of pelvis, initial encounter for closed fracture: Secondary | ICD-10-CM | POA: Diagnosis not present

## 2015-10-28 DIAGNOSIS — M545 Low back pain: Secondary | ICD-10-CM | POA: Diagnosis not present

## 2015-10-30 DIAGNOSIS — M545 Low back pain: Secondary | ICD-10-CM | POA: Diagnosis not present

## 2015-10-30 DIAGNOSIS — S3289XA Fracture of other parts of pelvis, initial encounter for closed fracture: Secondary | ICD-10-CM | POA: Diagnosis not present

## 2015-11-04 DIAGNOSIS — S3289XA Fracture of other parts of pelvis, initial encounter for closed fracture: Secondary | ICD-10-CM | POA: Diagnosis not present

## 2015-11-04 DIAGNOSIS — M545 Low back pain: Secondary | ICD-10-CM | POA: Diagnosis not present

## 2015-11-06 DIAGNOSIS — M545 Low back pain: Secondary | ICD-10-CM | POA: Diagnosis not present

## 2015-11-06 DIAGNOSIS — S3289XA Fracture of other parts of pelvis, initial encounter for closed fracture: Secondary | ICD-10-CM | POA: Diagnosis not present

## 2015-11-10 DIAGNOSIS — M545 Low back pain: Secondary | ICD-10-CM | POA: Diagnosis not present

## 2015-11-10 DIAGNOSIS — S3289XA Fracture of other parts of pelvis, initial encounter for closed fracture: Secondary | ICD-10-CM | POA: Diagnosis not present

## 2015-11-12 DIAGNOSIS — S32810D Multiple fractures of pelvis with stable disruption of pelvic ring, subsequent encounter for fracture with routine healing: Secondary | ICD-10-CM | POA: Diagnosis not present

## 2015-11-12 DIAGNOSIS — R102 Pelvic and perineal pain: Secondary | ICD-10-CM | POA: Diagnosis not present

## 2016-02-10 DIAGNOSIS — Z1159 Encounter for screening for other viral diseases: Secondary | ICD-10-CM | POA: Diagnosis not present

## 2016-02-10 DIAGNOSIS — Z6824 Body mass index (BMI) 24.0-24.9, adult: Secondary | ICD-10-CM | POA: Diagnosis not present

## 2016-02-10 DIAGNOSIS — Z0001 Encounter for general adult medical examination with abnormal findings: Secondary | ICD-10-CM | POA: Diagnosis not present

## 2016-02-10 DIAGNOSIS — M1990 Unspecified osteoarthritis, unspecified site: Secondary | ICD-10-CM | POA: Diagnosis not present

## 2016-02-10 DIAGNOSIS — M810 Age-related osteoporosis without current pathological fracture: Secondary | ICD-10-CM | POA: Diagnosis not present

## 2016-02-10 DIAGNOSIS — Z1389 Encounter for screening for other disorder: Secondary | ICD-10-CM | POA: Diagnosis not present

## 2016-02-10 DIAGNOSIS — Z791 Long term (current) use of non-steroidal anti-inflammatories (NSAID): Secondary | ICD-10-CM | POA: Diagnosis not present

## 2016-02-10 DIAGNOSIS — F17200 Nicotine dependence, unspecified, uncomplicated: Secondary | ICD-10-CM | POA: Diagnosis not present

## 2016-02-10 DIAGNOSIS — F411 Generalized anxiety disorder: Secondary | ICD-10-CM | POA: Diagnosis not present

## 2017-03-14 IMAGING — CT CT PELVIS W/O CM
1 series · 16 of 32 positions shown, 20 images · non-contrast
Comparison: Plain films earlier today

CLINICAL DATA: Fall while walking dog. Pain on right hip and groin
region.

EXAM:
CT PELVIS WITHOUT CONTRAST
TECHNIQUE: Multidetector CT imaging of the pelvis was performed following the
standard protocol without intravenous contrast.

[Series 5: soft tissue axials · axial · 0.67mm/px · z∈[-335,-89]mm · 16 of 137 slices shown, 20 images]
[im 9/137  soft-tissue]
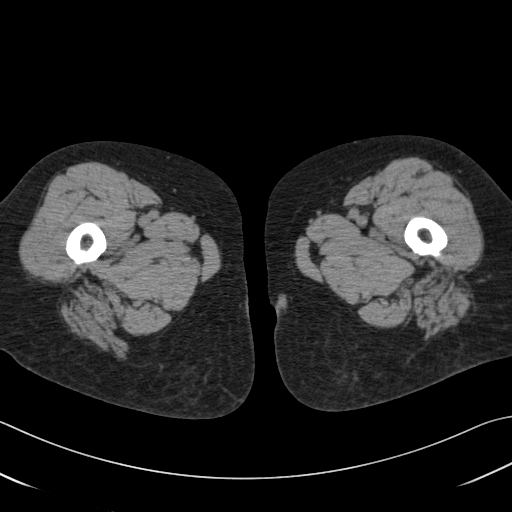
[im 9/137  bone]
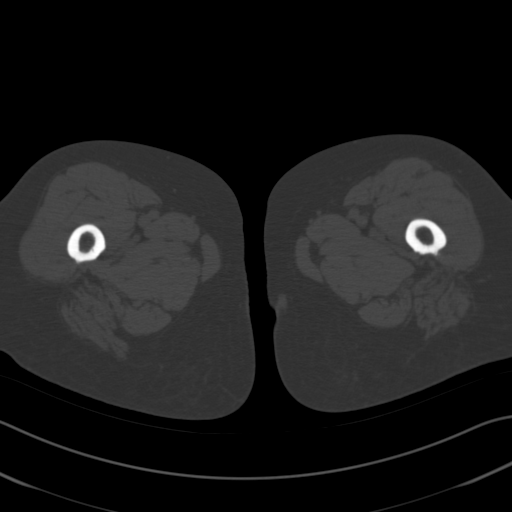
[im 18/137  soft-tissue]
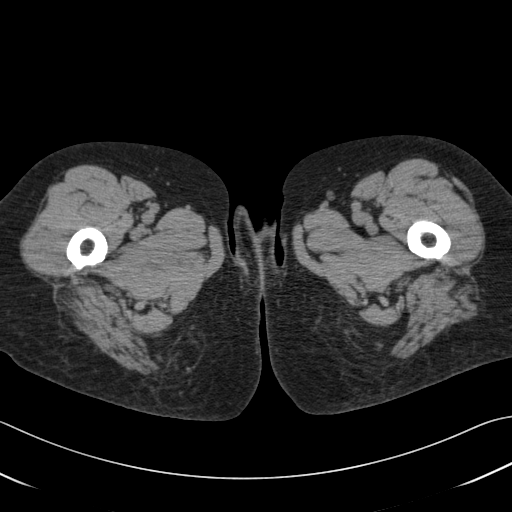
[im 27/137  soft-tissue]
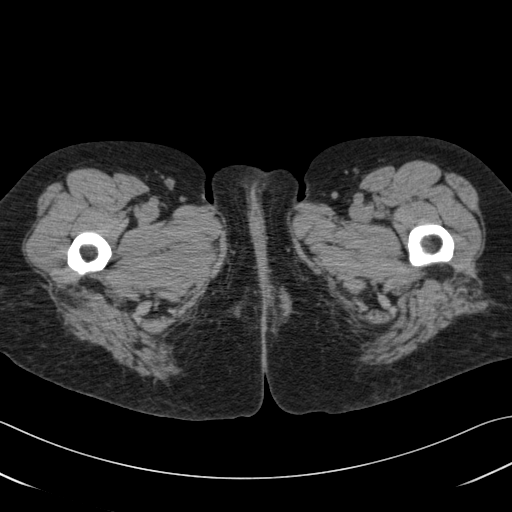
[im 36/137  soft-tissue]
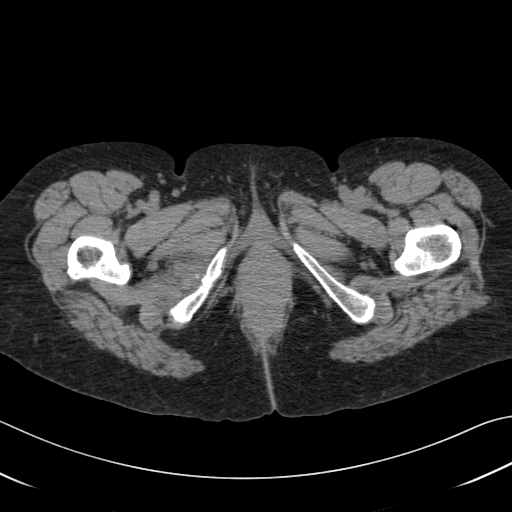
[im 44/137  soft-tissue]
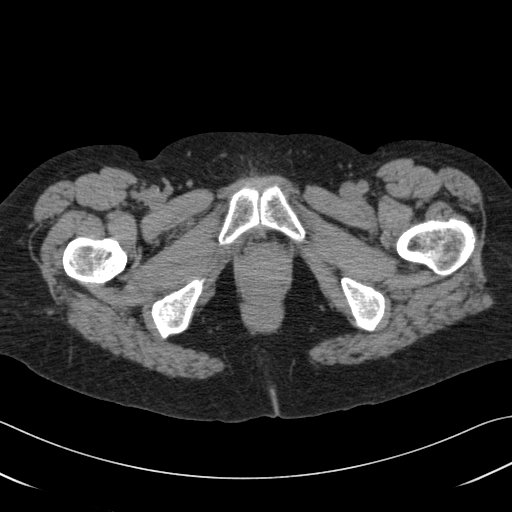
[im 53/137  soft-tissue]
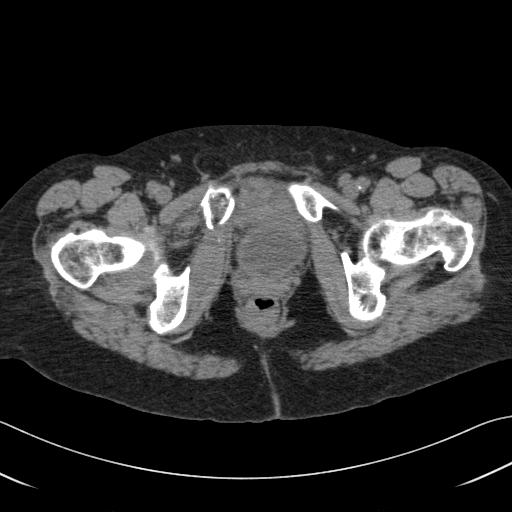
[im 62/137  soft-tissue]
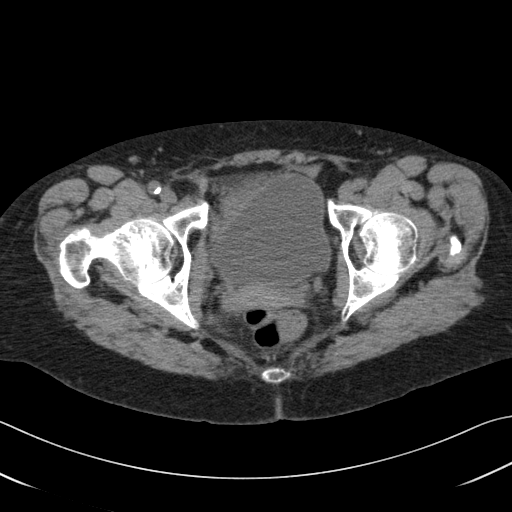
[im 75/137  soft-tissue]
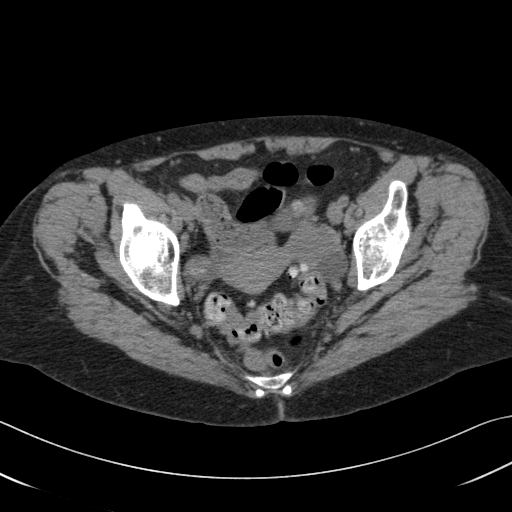
[im 84/137  soft-tissue]
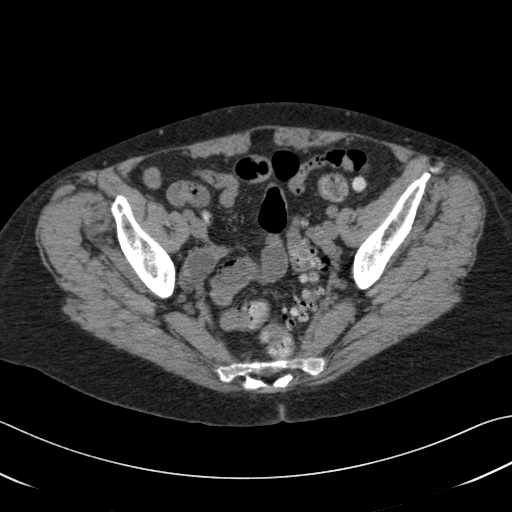
[im 84/137  bone]
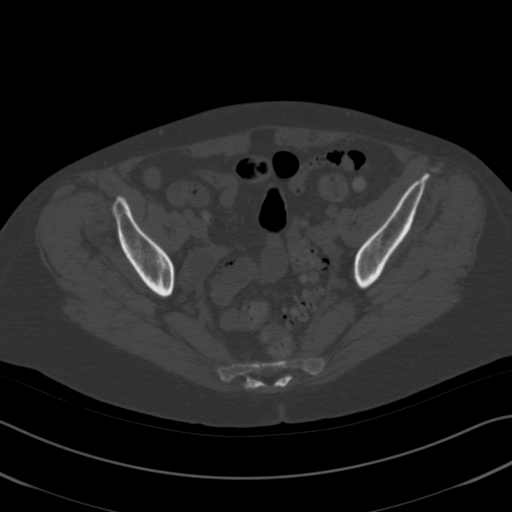
[im 93/137  soft-tissue]
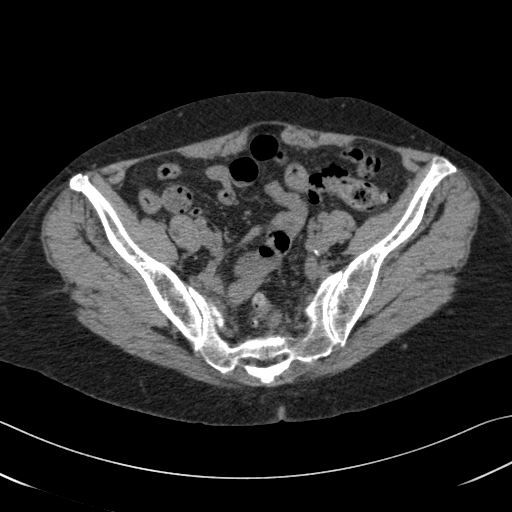
[im 101/137  soft-tissue]
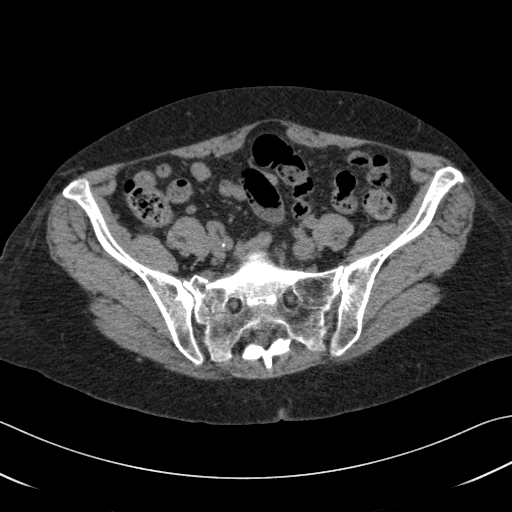
[im 110/137  soft-tissue]
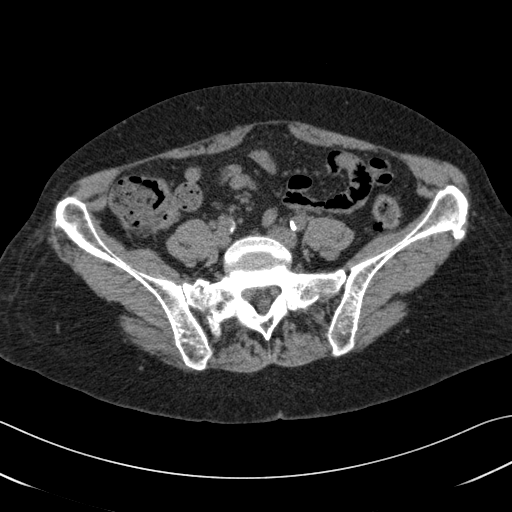
[im 119/137  soft-tissue]
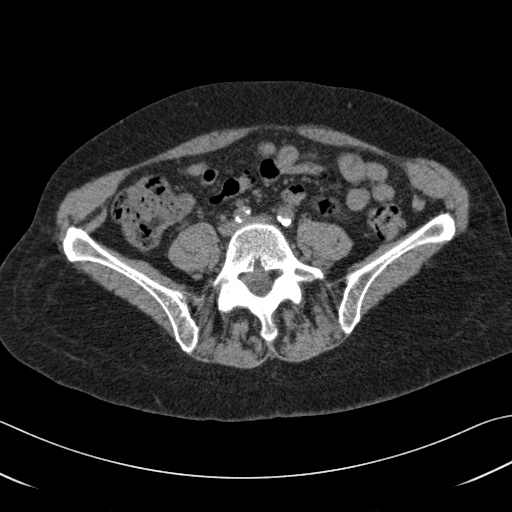
[im 119/137  lung]
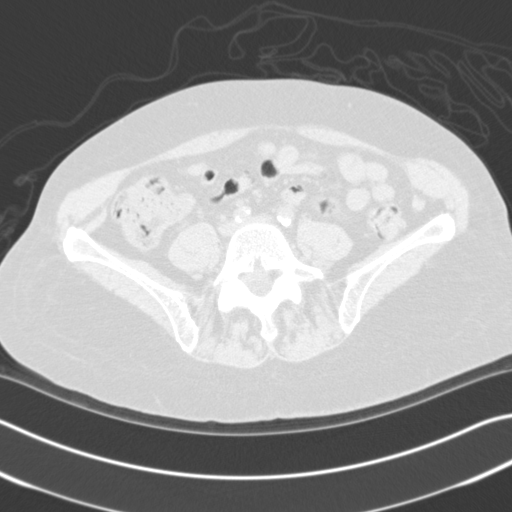
[im 123/137  lung]
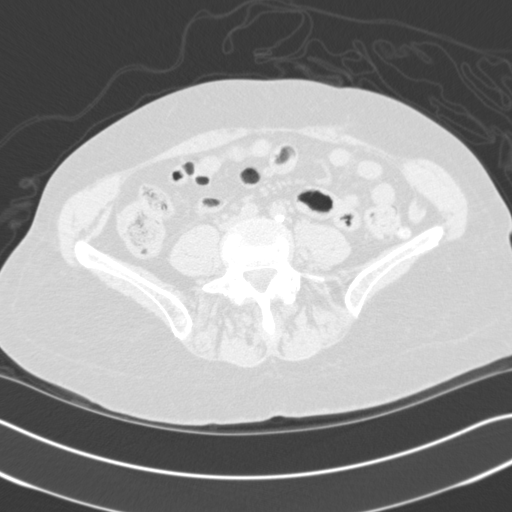
[im 128/137  soft-tissue]
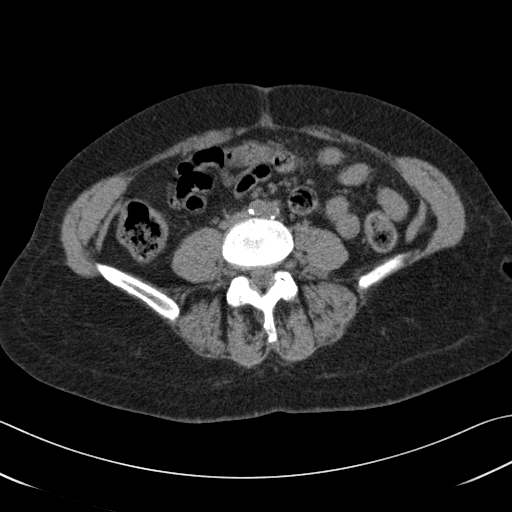
[im 128/137  lung]
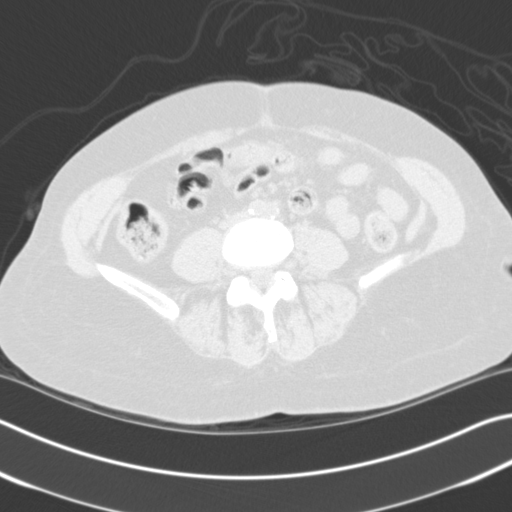
[im 132/137  lung]
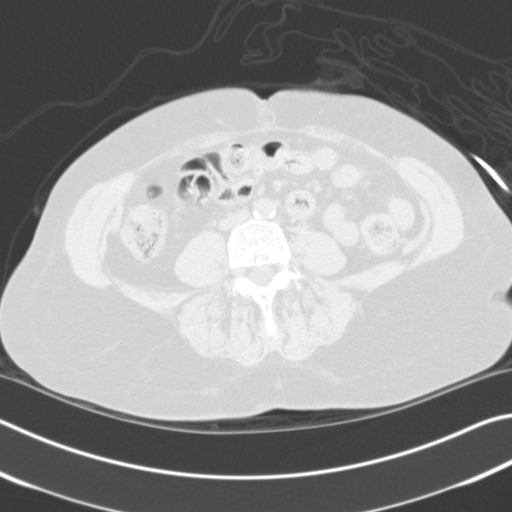

[16 of 32 positions shown; findings below may reference images not displayed]

FINDINGS: There are fractures through the right sacral ala. No definite left
sacral fracture. Fractures are noted through the right superior and
inferior pubic rami. No femoral neck fracture.

There is sigmoid diverticulosis. No active diverticulitis. Small
amount of stranding is noted along the anterior bladder surface
adjacent to the right pubic bone fractures compatible with small
extraperitoneal hematoma.
IMPRESSION: Fracture through the right sacrum, right superior and inferior pubic
rami.

Small right extraperitoneal hematoma adjacent to the right anterior
bladder wall.

## 2017-07-02 DIAGNOSIS — Z23 Encounter for immunization: Secondary | ICD-10-CM | POA: Diagnosis not present

## 2017-11-09 ENCOUNTER — Ambulatory Visit
Admission: EM | Admit: 2017-11-09 | Discharge: 2017-11-09 | Disposition: A | Discharge status: Home / Self Care | Payer: Medicare Other | Attending: Family Medicine | Admitting: Family Medicine

## 2017-11-09 DIAGNOSIS — H5711 Ocular pain, right eye: Secondary | ICD-10-CM | POA: Diagnosis not present

## 2017-11-09 DIAGNOSIS — K130 Diseases of lips: Secondary | ICD-10-CM

## 2017-11-09 MED ORDER — SULFURIC ACID-SULF PHENOLICS 30-50 % MT SOLN: OROMUCOSAL | 1 refills | Status: DC

## 2017-11-09 MED ORDER — OLOPATADINE HCL 0.2 % OP SOLN: OPHTHALMIC | 0 refills | Status: DC

## 2017-11-09 NOTE — ED Triage Notes (Signed)
Reporting right eye pain since couple of days ago.  Itchy. Watery.   Lip "blister" for a couple of days.  Started to outer, upper right side of lip and feels as if it is going across upper lip now.   Denies any contact with new chemicals or eating anything new/out of ordinary.   No breathing issues.   In NAD.

## 2017-11-09 NOTE — ED Provider Notes (Signed)
[facility]    CSN: [id number] Arrival date & time: 11/09/17  1658  History   Chief Complaint Chief Complaint  Patient presents with  . Eye Pain  . Blister   HPI   67 year old female presents with "blisters" on her upper lip and eye discomfort.  Patient reports a one-week history of "blisters" of her upper lip.  They are painful.  She has had no improvement with over-the-counter topical treatment.  No new exposures.  She is a smoker.  Pain is currently 8/10 in severity.  Additionally, patient reports right eye watering, itching, and associated pain.  No known inciting factor.  No known exacerbating relieving factors.  No reported vision changes.  No other associated symptoms.  No other complaints.  Past Medical History:  Diagnosis Date  . Anxiety   . Arthritis   . Hypertension     Patient Active Problem List   Diagnosis Date Noted  . Fracture of sacrum ([place]) 08/17/2015    Past Surgical History:  Procedure Laterality Date  . ANKLE SURGERY    . BREAST SURGERY     Cyst  . COLONOSCOPY WITH PROPOFOL N/A 07/20/2015   Procedure: COLONOSCOPY WITH PROPOFOL;  Surgeon: Manya Silvas, MD;  [facility]: [facility] ENDOSCOPY;  Service: Endoscopy;  Laterality: N/A;  . JOINT REPLACEMENT    . SHOULDER SURGERY      OB History    No data available       Home Medications    Prior to Admission medications   Medication Sig Start Date End Date Taking? Authorizing Provider  escitalopram (LEXAPRO) 10 MG tablet Take 10 mg by mouth daily.   Yes [provider]  meloxicam (MOBIC) 7.5 MG tablet Take 7.5 mg by mouth daily.    [provider]  Olopatadine HCl 0.2 % SOLN 1 drop to affected eye daily. 11/09/17   Coral Spikes, DO  Sulfuric Acid-Sulf Phenolics 37-90 % SOLN Apply as directed by package insert. 11/09/17   Coral Spikes, DO    Family History Family History  Problem Relation Age of Onset  . Cancer Mother   . CAD Father     Social History Social  History   Tobacco Use  . Smoking status: Current Every Day Smoker    Packs/day: 0.50    Years: 40.00    Pack years: 20.00  . Smokeless tobacco: Never Used  Substance Use Topics  . Alcohol use: Yes  . Drug use: No     Allergies   Morphine and related   Review of Systems Review of Systems  Eyes: Positive for pain and itching.  Skin:       Upper lip "blisters".   Physical Exam Triage Vital Signs ED Triage Vitals  Enc Vitals Group     BP 11/09/17 1715 (!) 127/53     Pulse Rate 11/09/17 1715 79     Resp 11/09/17 1715 16     Temp 11/09/17 1715 98 F (36.7 C)     Temp Source 11/09/17 1715 Oral     SpO2 11/09/17 1715 95 %     Weight --      Height --      Head Circumference --      Peak Flow --      Pain Score 11/09/17 1717 8     Pain Loc --      Pain Edu? --      Excl. in [place]? --    Updated Vital Signs BP [facility] Kitchen)  127/53 (BP Location: Left Arm)   Pulse 79   Temp 98 F (36.7 C) (Oral)   Resp 16   SpO2 95%   Physical Exam  Constitutional: She is oriented to person, place, and time. She appears well-developed. No distress.  HENT:  Head: Normocephalic and atraumatic.  Very poor dentition with numerous caries. Upper lip with 3 small areas of ulceration.  Eyes:  Right eye watering noted.  No conjunctival injection.  PERRLA.  Pulmonary/Chest: Effort normal. No respiratory distress.  Neurological: She is alert and oriented to person, place, and time.  Psychiatric: She has a normal mood and affect. Her behavior is normal.  Nursing note and vitals reviewed.  UC Treatments / Results  Labs (all labs ordered are listed, but only abnormal results are displayed) Labs Reviewed - No data to display  EKG  EKG Interpretation None       Radiology No results found.  Procedures Procedures (including critical care time)  Medications Ordered in UC Medications - No data to display   Initial Impression / Assessment and Plan / UC Course  I have reviewed the triage  vital signs and the nursing notes.  Pertinent labs & imaging results that were available during my care of the patient were reviewed by me and considered in my medical decision making (see chart for details).     67 year old female presents with likely allergic symptoms regarding her eye complaint.  Treating with Pataday.  Regarding her lip ulcerations, this is likely viral in origin.  I have prescribed Debacterol for these lesions.  If they persist, she should be evaluated by dermatology to evaluate for potential cancer especially in the light of her smoking history.  Final Clinical Impressions(s) / UC Diagnoses   Final diagnoses:  Pain of right eye  Lip ulceration    ED Discharge Orders        Ordered    Olopatadine HCl 0.2 % SOLN     11/09/17 1751    Sulfuric Acid-Sulf Phenolics 07-86 % SOLN     11/09/17 1751     Controlled Substance Prescriptions [place] Controlled Substance Registry consulted? Not Applicable   Coral Spikes, DO 11/09/17 1811

## 2017-11-09 NOTE — ED Notes (Signed)
Pt in treatment room.  In NAD.  Needs address.  Pt/Fam updated on POC.   

## 2017-11-09 NOTE — Discharge Instructions (Signed)
Medications as prescribed. ° °Take care ° °Dr. Jordin Vicencio  °

## 2017-12-05 ENCOUNTER — Encounter: Payer: Self-pay | Admitting: *Deleted

## 2017-12-05 ENCOUNTER — Ambulatory Visit
Admission: EM | Admit: 2017-12-05 | Discharge: 2017-12-05 | Disposition: A | Discharge status: Home / Self Care | Payer: Medicare Other | Attending: Family Medicine | Admitting: Family Medicine

## 2017-12-05 DIAGNOSIS — J301 Allergic rhinitis due to pollen: Secondary | ICD-10-CM | POA: Diagnosis not present

## 2017-12-05 DIAGNOSIS — J069 Acute upper respiratory infection, unspecified: Secondary | ICD-10-CM | POA: Diagnosis not present

## 2017-12-05 DIAGNOSIS — F17210 Nicotine dependence, cigarettes, uncomplicated: Secondary | ICD-10-CM

## 2017-12-05 MED ORDER — HYDROCOD POLST-CPM POLST ER 10-8 MG/5ML PO SUER: 5.0000 mL | Freq: Two times a day (BID) | ORAL | 0 refills | Status: DC

## 2017-12-05 MED ORDER — BENZONATATE 200 MG PO CAPS: ORAL_CAPSULE | ORAL | 0 refills | Status: DC

## 2017-12-05 NOTE — ED Triage Notes (Signed)
C/O of runny nose, productive clear cough since this past Saturday. No fever reported.

## 2017-12-05 NOTE — ED Provider Notes (Addendum)
[facility]    CSN: [id number] Arrival date & time: 12/05/17  1017     History   Chief Complaint Chief Complaint  Patient presents with  . Facial Pain    HPI Colleen Schaefer is a 67 y.o. female.   HPI  67 year old female presents with 4 days history of runny nose productive cough that is been clear and she is had no fever or chills.  Has lived here for 3 years.  Continues to smoke three quarters a pack a day.  Sats are 100% on room air.  Temperature 98 F pulse 74 blood pressure 109/64          Past Medical History:  Diagnosis Date  . Anxiety   . Arthritis   . Hypertension     Patient Active Problem List   Diagnosis Date Noted  . Fracture of sacrum ([place]) 08/17/2015    Past Surgical History:  Procedure Laterality Date  . ANKLE SURGERY    . BREAST SURGERY     Cyst  . COLONOSCOPY WITH PROPOFOL N/A 07/20/2015   Procedure: COLONOSCOPY WITH PROPOFOL;  Surgeon: Manya Silvas, MD;  Location: [facility] ENDOSCOPY;  Service: Endoscopy;  Laterality: N/A;  . JOINT REPLACEMENT    . SHOULDER SURGERY      OB History   None      Home Medications    Prior to Admission medications   Medication Sig Start Date End Date Taking? Authorizing Provider  escitalopram (LEXAPRO) 10 MG tablet Take 10 mg by mouth daily.   Yes [provider]  meloxicam (MOBIC) 7.5 MG tablet Take 7.5 mg by mouth daily.   Yes [provider]  benzonatate (TESSALON) 200 MG capsule Take one cap TID PRN cough 12/05/17   Lorin Picket, PA-C  chlorpheniramine-HYDROcodone [facility]) 10-8 MG/5ML SUER Take 5 mLs by mouth 2 (two) times daily. 12/05/17   Lorin Picket, PA-C  Olopatadine HCl 0.2 % SOLN 1 drop to affected eye daily. 11/09/17   Coral Spikes, DO    Family History Family History  Problem Relation Age of Onset  . Cancer Mother   . CAD Father     Social History Social History   Tobacco Use  . Smoking status: Current Every Day Smoker   Packs/day: 0.50    Years: 40.00    Pack years: 20.00  . Smokeless tobacco: Never Used  Substance Use Topics  . Alcohol use: Yes  . Drug use: No     Allergies   Morphine and related   Review of Systems Review of Systems  Constitutional: Positive for activity change. Negative for appetite change, chills, fatigue and fever.  HENT: Positive for congestion, postnasal drip, rhinorrhea, sinus pressure and sinus pain.   Respiratory: Positive for cough.      Physical Exam Triage Vital Signs ED Triage Vitals  Enc Vitals Group     BP 12/05/17 1046 109/64     Pulse Rate 12/05/17 1046 74     Resp 12/05/17 1046 16     Temp 12/05/17 1046 98 F (36.7 C)     Temp Source 12/05/17 1046 Oral     SpO2 12/05/17 1046 100 %     Weight 12/05/17 1043 135 lb (61.2 kg)     Height 12/05/17 1043 5\' 2"  (1.575 m)     Head Circumference --      Peak Flow --      Pain Score 12/05/17 1043 0     Pain  Loc --      Pain Edu? --      Excl. in [place]? --    No data found.  Updated Vital Signs BP 109/64 (BP Location: Left Arm)   Pulse 74   Temp 98 F (36.7 C) (Oral)   Resp 16   Ht 5\' 2"  (1.575 m)   Wt 135 lb (61.2 kg)   SpO2 100%   BMI 24.69 kg/m   Visual Acuity Right Eye Distance:   Left Eye Distance:   Bilateral Distance:    Right Eye Near:   Left Eye Near:    Bilateral Near:     Physical Exam  Constitutional: She is oriented to person, place, and time. She appears well-developed and well-nourished. No distress.  HENT:  Head: Normocephalic.  Right Ear: External ear normal.  Left Ear: External ear normal.  Mouth/Throat: Oropharynx is clear and moist. No oropharyngeal exudate.  Patient has nasal tone to her voice.  Eyes: Pupils are equal, round, and reactive to light. Right eye exhibits no discharge. Left eye exhibits no discharge.  Neck: Normal range of motion.  Pulmonary/Chest: Effort normal and breath sounds normal.  Musculoskeletal: Normal range of motion.  Lymphadenopathy:    She  has no cervical adenopathy.  Neurological: She is alert and oriented to person, place, and time.  Skin: Skin is warm and dry. She is not diaphoretic.  Psychiatric: She has a normal mood and affect. Her behavior is normal. Judgment and thought content normal.  Nursing note and vitals reviewed.    UC Treatments / Results  Labs (all labs ordered are listed, but only abnormal results are displayed) Labs Reviewed - No data to display  EKG None Radiology No results found.  Procedures Procedures (including critical care time)  Medications Ordered in UC Medications - No data to display   Initial Impression / Assessment and Plan / UC Course  I have reviewed the triage vital signs and the nursing notes.  Pertinent labs & imaging results that were available during my care of the patient were reviewed by me and considered in my medical decision making (see chart for details).     Plan: 1. Test/x-ray results and diagnosis reviewed with patient 2. rx as per orders; risks, benefits, potential side effects reviewed with patient 3. Recommend supportive treatment with use of Afrin nasal spray for 2 days only then continue using the Flonase nasal spray throughout the spring time.  Also recommend the use of Claritin Zyrtec or Allegra throughout the springtime air.  Prescribed cough suppressants.  I told her that this is likely a virus and does not require antibiotics at this time.  If she is not improving she should follow-up with her primary care physician and return to our clinic 4. F/u prn if symptoms worsen or don't improve   Final Clinical Impressions(s) / UC Diagnoses   Final diagnoses:  Acute upper respiratory infection  Seasonal allergic rhinitis due to pollen    ED Discharge Orders        Ordered    benzonatate (TESSALON) 200 MG capsule     12/05/17 1128    chlorpheniramine-HYDROcodone (TUSSIONEX PENNKINETIC ER) 10-8 MG/5ML SUER  2 times daily     12/05/17 1128        Controlled Substance Prescriptions [place] Controlled Substance Registry consulted? Not Applicable   Lorin Picket, PA-C 12/05/17 1139    Lorin Picket, PA-C 12/05/17 1140

## 2017-12-05 NOTE — Discharge Instructions (Addendum)
Use Afrin nasal spray for 2 days only.  At the same time use Flonase nasal spray but continue the Flonase throughout the spring season.  Also begin taking Zyrtec Allegra or Claritin on a daily basis during the spring season.

## 2017-12-21 ENCOUNTER — Encounter: Payer: Self-pay | Admitting: *Deleted

## 2017-12-21 ENCOUNTER — Ambulatory Visit
Admission: EM | Admit: 2017-12-21 | Discharge: 2017-12-21 | Disposition: A | Discharge status: Home / Self Care | Payer: Medicare Other | Attending: Family Medicine | Admitting: Family Medicine

## 2017-12-21 DIAGNOSIS — H9209 Otalgia, unspecified ear: Secondary | ICD-10-CM | POA: Diagnosis present

## 2017-12-21 DIAGNOSIS — F17210 Nicotine dependence, cigarettes, uncomplicated: Secondary | ICD-10-CM | POA: Diagnosis not present

## 2017-12-21 DIAGNOSIS — J029 Acute pharyngitis, unspecified: Secondary | ICD-10-CM | POA: Diagnosis present

## 2017-12-21 DIAGNOSIS — J020 Streptococcal pharyngitis: Secondary | ICD-10-CM | POA: Diagnosis not present

## 2017-12-21 DIAGNOSIS — R52 Pain, unspecified: Secondary | ICD-10-CM | POA: Diagnosis present

## 2017-12-21 LAB — RAPID STREP SCREEN (MED CTR MEBANE ONLY): STREPTOCOCCUS, GROUP A SCREEN (DIRECT): POSITIVE — AB

## 2017-12-21 MED ORDER — AMOXICILLIN 500 MG PO TABS: 500.0000 mg | ORAL_TABLET | Freq: Two times a day (BID) | ORAL | 0 refills | Status: DC

## 2017-12-21 NOTE — ED Provider Notes (Signed)
[facility]    CSN: [id number] Arrival date & time: 12/21/17  1806  History   Chief Complaint Chief Complaint  Patient presents with  . Sore Throat  . Otalgia  . Generalized Body Aches   HPI  67 year old female presents with the above complaints.  Patient states that been sick for the past 2-3 days.  Had severe sore throat, ear pain, body aches.  He said several sick contacts as she works at [employer].  Symptoms have been worsening.  No known exacerbating/relieving factors.  No other associated symptoms.  No other concerns at this time.  Past Medical History:  Diagnosis Date  . Anxiety   . Arthritis   . Hypertension    Patient Active Problem List   Diagnosis Date Noted  . Fracture of sacrum ([place]) 08/17/2015   Past Surgical History:  Procedure Laterality Date  . ANKLE SURGERY    . BREAST SURGERY     Cyst  . COLONOSCOPY WITH PROPOFOL N/A 07/20/2015   Procedure: COLONOSCOPY WITH PROPOFOL;  Surgeon: Manya Silvas, MD;  Location: [facility] ENDOSCOPY;  Service: Endoscopy;  Laterality: N/A;  . JOINT REPLACEMENT    . SHOULDER SURGERY     OB History   None    Home Medications    Prior to Admission medications   Medication Sig Start Date End Date Taking? Authorizing Provider  amoxicillin (AMOXIL) 500 MG tablet Take 1 tablet (500 mg total) by mouth 2 (two) times daily. 12/21/17   Coral Spikes, DO   Family History Family History  Problem Relation Age of Onset  . Cancer Mother   . CAD Father    Social History Social History   Tobacco Use  . Smoking status: Current Every Day Smoker    Packs/day: 0.50    Years: 40.00    Pack years: 20.00  . Smokeless tobacco: Never Used  Substance Use Topics  . Alcohol use: Yes  . Drug use: No   Allergies   Morphine and related  Review of Systems Review of Systems  Constitutional: Negative for fever.  HENT: Positive for ear pain and sore throat.   Musculoskeletal:       Body aches.   Physical  Exam Triage Vital Signs ED Triage Vitals  Enc Vitals Group     BP 12/21/17 1823 124/74     Pulse Rate 12/21/17 1823 99     Resp 12/21/17 1823 16     Temp 12/21/17 1823 98.9 F (37.2 C)     Temp Source 12/21/17 1823 Oral     SpO2 12/21/17 1823 97 %     Weight 12/21/17 1827 135 lb (61.2 kg)     Height 12/21/17 1827 5\' 2"  (1.575 m)     Head Circumference --      Peak Flow --      Pain Score 12/21/17 1826 0     Pain Loc --      Pain Edu? --      Excl. in [place]? --     Updated Vital Signs BP 124/74 (BP Location: Left Arm)   Pulse 99   Temp 98.9 F (37.2 C) (Oral)   Resp 16   Ht 5\' 2"  (1.575 m)   Wt 135 lb (61.2 kg)   SpO2 97%   BMI 24.69 kg/m   Physical Exam  Constitutional: She is oriented to person, place, and time. She appears well-developed. No distress.  HENT:  Head: Normocephalic and atraumatic.  Oropharynx with moderate  erythema.  No exudate.  Cardiovascular: Normal rate and regular rhythm.  Pulmonary/Chest: Effort normal and breath sounds normal. She has no wheezes. She has no rales.  Neurological: She is alert and oriented to person, place, and time.  Psychiatric: She has a normal mood and affect. Her behavior is normal.  Nursing note and vitals reviewed.  UC Treatments / Results  Labs (all labs ordered are listed, but only abnormal results are displayed) Labs Reviewed  RAPID STREP SCREEN ([facility] ONLY) - Abnormal; Notable for the following components:      Result Value   Streptococcus, Group A Screen (Direct) POSITIVE (*)    All other components within normal limits    EKG None Radiology No results found.  Procedures Procedures (including critical care time)  Medications Ordered in UC Medications - No data to display   Initial Impression / Assessment and Plan / UC Course  I have reviewed the triage vital signs and the nursing notes.  Pertinent labs & imaging results that were available during my care of the patient were reviewed by me and  considered in my medical decision making (see chart for details).     67 year old female presents with strep pharyngitis.  Treating with amoxicillin.  Final Clinical Impressions(s) / UC Diagnoses   Final diagnoses:  Strep pharyngitis    ED Discharge Orders        Ordered    amoxicillin (AMOXIL) 500 MG tablet  2 times daily     12/21/17 1846     Controlled Substance Prescriptions [place] Controlled Substance Registry consulted? Not Applicable   Coral Spikes, DO 12/21/17 1955

## 2017-12-21 NOTE — ED Triage Notes (Signed)
Sore throat, ear pain, and body aches x3 days. Recent URI 3 weeks ago that pt says didn't completely resolve.

## 2018-05-05 ENCOUNTER — Other Ambulatory Visit: Payer: Self-pay

## 2018-05-05 ENCOUNTER — Ambulatory Visit
Admission: EM | Admit: 2018-05-05 | Discharge: 2018-05-05 | Disposition: A | Discharge status: Home / Self Care | Payer: Medicare Other | Attending: Family Medicine | Admitting: Family Medicine

## 2018-05-05 DIAGNOSIS — K0889 Other specified disorders of teeth and supporting structures: Secondary | ICD-10-CM

## 2018-05-05 MED ORDER — PENICILLIN V POTASSIUM 500 MG PO TABS: 500.0000 mg | ORAL_TABLET | Freq: Four times a day (QID) | ORAL | 0 refills | Status: AC

## 2018-05-05 MED ORDER — LIDOCAINE VISCOUS HCL 2 % MT SOLN: 5.0000 mL | Freq: Four times a day (QID) | OROMUCOSAL | 0 refills | Status: AC | PRN

## 2018-05-05 NOTE — ED Provider Notes (Signed)
[facility] ____________________________________________  Time seen: Approximately 1:47 PM  I have reviewed the triage vital signs and the nursing notes.   HISTORY  Chief Complaint Dental Pain    HPI Colleen Schaefer is a 67 y.o. female presenting for evaluation of right upper tooth pain and some swelling around the tooth present for last 2 days.  States that the tooth is "just bad ".  Plans to follow-up with dentist next week, but concern for infection prompting her to come in in today.  Has taken some ibuprofen last night which did help some.  Denies fevers.  Continues to eat and drink well.  Did also apply some topical which did not change much.  Denies any other aggravating alleviating factors.  Reports otherwise feels well.  Lynnell Jude, MD: PCP  Past Medical History:  Diagnosis Date  . Anxiety   . Arthritis   . Hypertension     Patient Active Problem List   Diagnosis Date Noted  . Fracture of sacrum ([place]) 08/17/2015    Past Surgical History:  Procedure Laterality Date  . ANKLE SURGERY    . BREAST SURGERY     Cyst  . COLONOSCOPY WITH PROPOFOL N/A 07/20/2015   Procedure: COLONOSCOPY WITH PROPOFOL;  Surgeon: Manya Silvas, MD;  Location: [facility];  Service: Endoscopy;  Laterality: N/A;  . JOINT REPLACEMENT    . SHOULDER SURGERY      Current Outpatient Rx  . Order #: [id number] Class: Historical Med  . Order #: [id number] Class: Normal  . Order #: [id number] Class: Normal    Allergies Morphine and related  Family History  Problem Relation Age of Onset  . Cancer Mother   . CAD Father     Social History Social History   Tobacco Use  . Smoking status: Current Every Day Smoker    Packs/day: 0.50    Years: 40.00    Pack years: 20.00  . Smokeless tobacco: Never Used  Substance Use Topics  . Alcohol use: Yes    Comment: social  . Drug use: No    Review of Systems Constitutional: No fever/chills. Reports continues to eat and drink  foods and fluids well.  Eyes: No visual changes. ENT: as above. Mild right sore throat.  Cardiovascular: Denies chest pain. Respiratory: Denies shortness of breath. Gastrointestinal: No abdominal pain.  No nausea, no vomiting.  Genitourinary: Negative for dysuria. Musculoskeletal: Negative for back pain. Skin: Negative for rash.   ____________________________________________   PHYSICAL EXAM:  VITAL SIGNS: ED Triage Vitals  Enc Vitals Group     BP 05/05/18 1311 138/62     Pulse Rate 05/05/18 1311 72     Resp 05/05/18 1311 16     Temp 05/05/18 1311 98.1 F (36.7 C)     Temp Source 05/05/18 1311 Oral     SpO2 05/05/18 1311 98 %     Weight 05/05/18 1312 130 lb (59 kg)     Height 05/05/18 1312 5' 2.5" (1.588 m)     Head Circumference --      Peak Flow --      Pain Score 05/05/18 1312 5     Pain Loc --      Pain Edu? --      Excl. in [place]? --     Constitutional: Alert and oriented. Well appearing and in no acute distress. Eyes: Conjunctivae are normal. PERRL. EOMI. Head: Atraumatic.  No facial swelling or erythema noted. Ears: Bilateral ears no erythema, normal TMs.  Nose: No  congestion/rhinnorhea. Mouth/Throat: Mucous membranes are moist.  Oropharynx non-erythematous. Periodontal Exam     Tenderness to an immediate surrounding tooth #2 as depicted above, gumline erythema and mild local edema.  Widespread dental decay. Neck: No stridor.  Hematological/Lymphatic/Immunilogical: No cervical lymphadenopathy. Cardiovascular:   Normal rate, regular rhythm. Grossly normal heart sounds. Good peripheral circulation. Respiratory: Normal respiratory effort.  No retractions. Musculoskeletal: Steady gait Neurologic:  Normal speech and language. Speech is normal. No gait instability. Skin:  Skin is warm, dry and intact. No rash noted. Psychiatric: Mood and affect are normal. Speech and behavior are normal.  ____________________________________________   LABS (all labs ordered are  listed, but only abnormal results are displayed)  Labs Reviewed - No data to display ____________________________________________   INITIAL IMPRESSION / ASSESSMENT AND PLAN / ED COURSE  Pertinent labs & imaging results that were available during my care of the patient were reviewed by me and considered in my medical decision making (see chart for details).  Well-appearing patient.  No acute distress.  Widespread dental decay.  Suspect secondary to dental infection.  Will treat with Pen-VK and topical lidocaine.  Encourage rest, fluids, supportive care and strict follow-up with dentist.Discussed indication, risks and benefits of medications with patient.  Patient was advised to see the dentist within 10 days. Also advised to take the antibiotic until finished. Instructed to return to the Urgent Care or ER as needed. ____________________________________________   FINAL CLINICAL IMPRESSION(S) / ED DIAGNOSES  Final diagnoses:  Pain, dental         Marylene Land, NP 05/05/18 1350

## 2018-05-05 NOTE — Discharge Instructions (Signed)
Take medication as prescribed. Rest. Drink plenty of fluids. Soft foods.   Follow up with dentist this week.   Follow up with your primary care physician this week as needed. Return to Urgent care for new or worsening concerns.

## 2018-05-05 NOTE — ED Triage Notes (Signed)
Pt with two days of right upper dental pain. "I know it's an abscess."  Pain 5/10

## 2018-05-14 DIAGNOSIS — L03811 Cellulitis of head [any part, except face]: Secondary | ICD-10-CM | POA: Diagnosis not present

## 2018-05-14 DIAGNOSIS — Z6821 Body mass index (BMI) 21.0-21.9, adult: Secondary | ICD-10-CM | POA: Diagnosis not present

## 2018-05-15 ENCOUNTER — Other Ambulatory Visit: Payer: Self-pay | Admitting: Family Medicine

## 2018-05-15 DIAGNOSIS — Z1231 Encounter for screening mammogram for malignant neoplasm of breast: Secondary | ICD-10-CM

## 2018-05-21 ENCOUNTER — Ambulatory Visit
Admission: RE | Admit: 2018-05-21 | Discharge: 2018-05-21 | Disposition: A | Discharge status: Home / Self Care | Payer: Medicare Other | Source: Ambulatory Visit | Attending: Family Medicine | Admitting: Family Medicine

## 2018-05-21 ENCOUNTER — Encounter (INDEPENDENT_AMBULATORY_CARE_PROVIDER_SITE_OTHER): Payer: Self-pay

## 2018-05-21 DIAGNOSIS — Z1231 Encounter for screening mammogram for malignant neoplasm of breast: Secondary | ICD-10-CM | POA: Diagnosis not present

## 2018-06-04 ENCOUNTER — Other Ambulatory Visit: Payer: Self-pay | Admitting: *Deleted

## 2018-06-04 ENCOUNTER — Inpatient Hospital Stay
Admission: RE | Admit: 2018-06-04 | Discharge: 2018-06-04 | Disposition: A | Discharge status: Home / Self Care | Payer: Self-pay | Source: Ambulatory Visit | Attending: *Deleted | Admitting: *Deleted

## 2018-06-04 DIAGNOSIS — Z9289 Personal history of other medical treatment: Secondary | ICD-10-CM

## 2018-07-08 DIAGNOSIS — Z23 Encounter for immunization: Secondary | ICD-10-CM | POA: Diagnosis not present

## 2019-06-17 DIAGNOSIS — Z23 Encounter for immunization: Secondary | ICD-10-CM | POA: Diagnosis not present

## 2019-07-10 DIAGNOSIS — R1030 Lower abdominal pain, unspecified: Secondary | ICD-10-CM | POA: Diagnosis not present

## 2019-07-10 DIAGNOSIS — R350 Frequency of micturition: Secondary | ICD-10-CM | POA: Diagnosis not present

## 2019-07-10 DIAGNOSIS — N3001 Acute cystitis with hematuria: Secondary | ICD-10-CM | POA: Diagnosis not present

## 2019-07-10 DIAGNOSIS — F17210 Nicotine dependence, cigarettes, uncomplicated: Secondary | ICD-10-CM | POA: Diagnosis not present

## 2019-07-26 DIAGNOSIS — R5381 Other malaise: Secondary | ICD-10-CM | POA: Diagnosis not present

## 2019-07-26 DIAGNOSIS — M1990 Unspecified osteoarthritis, unspecified site: Secondary | ICD-10-CM | POA: Diagnosis not present

## 2019-07-26 DIAGNOSIS — R5383 Other fatigue: Secondary | ICD-10-CM | POA: Diagnosis not present

## 2019-07-26 DIAGNOSIS — K047 Periapical abscess without sinus: Secondary | ICD-10-CM | POA: Diagnosis not present
